# Patient Record
Sex: Male | Born: 1955
Health system: Southern US, Community
[De-identification: ages and names within clinical notes are randomized; demographics above are authoritative.]

## PROBLEM LIST (undated history)

## (undated) DIAGNOSIS — M1990 Unspecified osteoarthritis, unspecified site: Secondary | ICD-10-CM

## (undated) DIAGNOSIS — I10 Essential (primary) hypertension: Secondary | ICD-10-CM

## (undated) HISTORY — DX: Essential (primary) hypertension: I10

## (undated) HISTORY — DX: Unspecified osteoarthritis, unspecified site: M19.90

## (undated) HISTORY — PX: INGUINAL HERNIA REPAIR: SUR1180

---

## 2001-11-30 ENCOUNTER — Encounter: Admission: RE | Admit: 2001-11-30 | Discharge: 2002-01-31 | Payer: Self-pay

## 2001-12-28 ENCOUNTER — Ambulatory Visit (HOSPITAL_COMMUNITY): Admission: RE | Admit: 2001-12-28 | Discharge: 2001-12-28 | Payer: Self-pay

## 2002-07-03 ENCOUNTER — Encounter: Payer: Self-pay | Admitting: Family Medicine

## 2002-07-03 ENCOUNTER — Encounter: Admission: RE | Admit: 2002-07-03 | Discharge: 2002-07-03 | Payer: Self-pay | Admitting: Family Medicine

## 2003-11-27 ENCOUNTER — Encounter: Admission: RE | Admit: 2003-11-27 | Discharge: 2003-11-27 | Payer: Self-pay | Admitting: Family Medicine

## 2004-06-09 ENCOUNTER — Ambulatory Visit: Payer: Self-pay | Admitting: Family Medicine

## 2004-06-23 ENCOUNTER — Ambulatory Visit: Payer: Self-pay | Admitting: Family Medicine

## 2004-08-27 ENCOUNTER — Ambulatory Visit: Payer: Self-pay | Admitting: Family Medicine

## 2005-06-16 ENCOUNTER — Ambulatory Visit: Payer: Self-pay | Admitting: Family Medicine

## 2006-01-05 ENCOUNTER — Ambulatory Visit: Payer: Self-pay | Admitting: Internal Medicine

## 2006-01-19 ENCOUNTER — Ambulatory Visit: Payer: Self-pay | Admitting: Family Medicine

## 2006-04-12 ENCOUNTER — Ambulatory Visit: Payer: Self-pay | Admitting: Family Medicine

## 2006-05-04 HISTORY — PX: COLONOSCOPY: SHX174

## 2006-06-22 ENCOUNTER — Ambulatory Visit: Payer: Self-pay | Admitting: Family Medicine

## 2006-06-23 LAB — CONVERTED CEMR LAB
ALT: 23 units/L (ref 0–40)
AST: 25 units/L (ref 0–37)
Albumin: 3.7 g/dL (ref 3.5–5.2)
Alkaline Phosphatase: 53 units/L (ref 39–117)
BUN: 6 mg/dL (ref 6–23)
Basophils Absolute: 0 10*3/uL (ref 0.0–0.1)
Basophils Relative: 0.5 % (ref 0.0–1.0)
Bilirubin, Direct: 0.1 mg/dL (ref 0.0–0.3)
CO2: 30 meq/L (ref 19–32)
Calcium: 8.8 mg/dL (ref 8.4–10.5)
Chloride: 107 meq/L (ref 96–112)
Cholesterol: 213 mg/dL (ref 0–200)
Creatinine, Ser: 0.6 mg/dL (ref 0.4–1.5)
Direct LDL: 151.8 mg/dL
Eosinophils Absolute: 0.1 10*3/uL (ref 0.0–0.6)
Eosinophils Relative: 2.6 % (ref 0.0–5.0)
GFR calc Af Amer: 185 mL/min
GFR calc non Af Amer: 153 mL/min
Glucose, Bld: 84 mg/dL (ref 70–99)
HCT: 34.9 % — ABNORMAL LOW (ref 39.0–52.0)
HDL: 39.8 mg/dL (ref >39.0)
Hemoglobin: 11.8 g/dL — ABNORMAL LOW (ref 13.0–17.0)
Lymphocytes Relative: 47.4 % — ABNORMAL HIGH (ref 12.0–46.0)
MCHC: 33.7 g/dL (ref 30.0–36.0)
MCV: 100.6 fL — ABNORMAL HIGH (ref 78.0–100.0)
Monocytes Absolute: 0.5 10*3/uL (ref 0.2–0.7)
Monocytes Relative: 8.3 % (ref 3.0–11.0)
Neutro Abs: 2.3 10*3/uL (ref 1.4–7.7)
Neutrophils Relative %: 41.2 % — ABNORMAL LOW (ref 43.0–77.0)
PSA: 0.57 ng/mL (ref 0.10–4.00)
Platelets: 250 10*3/uL (ref 150–400)
Potassium: 3.8 meq/L (ref 3.5–5.1)
RBC: 3.47 M/uL — ABNORMAL LOW (ref 4.22–5.81)
RDW: 11.3 % — ABNORMAL LOW (ref 11.5–14.6)
Sodium: 142 meq/L (ref 135–145)
TSH: 0.7 microintl units/mL (ref 0.35–5.50)
Total Bilirubin: 1.2 mg/dL (ref 0.3–1.2)
Total CHOL/HDL Ratio: 5.4
Total Protein: 6.9 g/dL (ref 6.0–8.3)
Triglycerides: 110 mg/dL (ref 0–149)
VLDL: 22 mg/dL (ref 0–40)
WBC: 5.6 10*3/uL (ref 4.5–10.5)

## 2006-06-28 ENCOUNTER — Ambulatory Visit: Payer: Self-pay | Admitting: Family Medicine

## 2006-06-28 LAB — CONVERTED CEMR LAB
Retic Count, Absolute: 22 (ref 19.0–186.0)
Retic Ct Pct: 0.6 % (ref 0.4–3.1)

## 2006-06-29 LAB — CONVERTED CEMR LAB
Basophils Absolute: 0.1 10*3/uL (ref 0.0–0.1)
Basophils Relative: 1.7 % — ABNORMAL HIGH (ref 0.0–1.0)
Eosinophils Absolute: 0.1 10*3/uL (ref 0.0–0.6)
Eosinophils Relative: 3.5 % (ref 0.0–5.0)
Ferritin: 193 ng/mL (ref 22.0–322.0)
Folate: 6.1 ng/mL
HCT: 35.3 % — ABNORMAL LOW (ref 39.0–52.0)
Hemoglobin: 11.8 g/dL — ABNORMAL LOW (ref 13.0–17.0)
Iron: 52 ug/dL (ref 42–165)
Lymphocytes Relative: 32.5 % (ref 12.0–46.0)
MCHC: 33.3 g/dL (ref 30.0–36.0)
MCV: 100.7 fL — ABNORMAL HIGH (ref 78.0–100.0)
Monocytes Absolute: 0.3 10*3/uL (ref 0.2–0.7)
Monocytes Relative: 7.4 % (ref 3.0–11.0)
Neutro Abs: 2.2 10*3/uL (ref 1.4–7.7)
Neutrophils Relative %: 54.9 % (ref 43.0–77.0)
Platelets: 202 10*3/uL (ref 150–400)
RBC: 3.51 M/uL — ABNORMAL LOW (ref 4.22–5.81)
RDW: 11.4 % — ABNORMAL LOW (ref 11.5–14.6)
Saturation Ratios: 17.3 % — ABNORMAL LOW (ref 20.0–50.0)
Transferrin: 214.4 mg/dL (ref >=212.0)
Vitamin B-12: 1471 pg/mL — ABNORMAL HIGH (ref 211–911)
WBC: 4 10*3/uL — ABNORMAL LOW (ref 4.5–10.5)

## 2006-07-15 ENCOUNTER — Ambulatory Visit: Payer: Self-pay | Admitting: Internal Medicine

## 2006-08-12 ENCOUNTER — Encounter: Payer: Self-pay | Admitting: Family Medicine

## 2006-08-12 ENCOUNTER — Ambulatory Visit: Payer: Self-pay | Admitting: Internal Medicine

## 2006-12-27 DIAGNOSIS — M545 Low back pain: Secondary | ICD-10-CM

## 2006-12-27 DIAGNOSIS — I10 Essential (primary) hypertension: Secondary | ICD-10-CM | POA: Insufficient documentation

## 2006-12-27 DIAGNOSIS — M1990 Unspecified osteoarthritis, unspecified site: Secondary | ICD-10-CM | POA: Insufficient documentation

## 2007-02-11 DIAGNOSIS — J069 Acute upper respiratory infection, unspecified: Secondary | ICD-10-CM | POA: Insufficient documentation

## 2007-02-15 ENCOUNTER — Ambulatory Visit: Payer: Self-pay | Admitting: Family Medicine

## 2007-02-15 DIAGNOSIS — F17200 Nicotine dependence, unspecified, uncomplicated: Secondary | ICD-10-CM

## 2007-06-16 ENCOUNTER — Ambulatory Visit: Payer: Self-pay | Admitting: Family Medicine

## 2007-06-16 LAB — CONVERTED CEMR LAB
Albumin: 3.6 g/dL (ref 3.5–5.2)
Basophils Absolute: 0 10*3/uL (ref 0.0–0.1)
Bilirubin Urine: NEGATIVE
Blood in Urine, dipstick: NEGATIVE
Cholesterol: 222 mg/dL (ref 0–200)
Direct LDL: 151.1 mg/dL
Eosinophils Absolute: 0.1 10*3/uL (ref 0.0–0.6)
GFR calc Af Amer: 132 mL/min
GFR calc non Af Amer: 109 mL/min
Glucose, Urine, Semiquant: NEGATIVE
HCT: 35.2 % — ABNORMAL LOW (ref 39.0–52.0)
Hemoglobin: 12 g/dL — ABNORMAL LOW (ref 13.0–17.0)
Ketones, urine, test strip: NEGATIVE
MCHC: 34.2 g/dL (ref 30.0–36.0)
MCV: 99.1 fL (ref 78.0–100.0)
Monocytes Absolute: 0.3 10*3/uL (ref 0.2–0.7)
Neutrophils Relative %: 50.9 % (ref 43.0–77.0)
Nitrite: NEGATIVE
PSA: 0.62 ng/mL (ref 0.10–4.00)
Potassium: 4.5 meq/L (ref 3.5–5.1)
Protein, U semiquant: NEGATIVE
Sodium: 141 meq/L (ref 135–145)
Specific Gravity, Urine: >=1.03
Triglycerides: 72 mg/dL (ref 0–149)
Urobilinogen, UA: 2
WBC Urine, dipstick: NEGATIVE
pH: 6

## 2007-06-23 ENCOUNTER — Ambulatory Visit: Payer: Self-pay | Admitting: Family Medicine

## 2007-06-23 DIAGNOSIS — F528 Other sexual dysfunction not due to a substance or known physiological condition: Secondary | ICD-10-CM

## 2007-06-23 DIAGNOSIS — N529 Male erectile dysfunction, unspecified: Secondary | ICD-10-CM | POA: Insufficient documentation

## 2007-08-17 ENCOUNTER — Ambulatory Visit: Payer: Self-pay | Admitting: Family Medicine

## 2007-08-18 ENCOUNTER — Encounter (INDEPENDENT_AMBULATORY_CARE_PROVIDER_SITE_OTHER): Payer: Self-pay | Admitting: *Deleted

## 2007-10-07 ENCOUNTER — Ambulatory Visit: Payer: Self-pay | Admitting: Family Medicine

## 2007-10-07 DIAGNOSIS — IMO0001 Reserved for inherently not codable concepts without codable children: Secondary | ICD-10-CM

## 2007-11-14 ENCOUNTER — Ambulatory Visit: Payer: Self-pay | Admitting: Family Medicine

## 2008-09-18 ENCOUNTER — Ambulatory Visit: Payer: Self-pay | Admitting: Family Medicine

## 2008-09-18 DIAGNOSIS — M7650 Patellar tendinitis, unspecified knee: Secondary | ICD-10-CM

## 2008-09-18 LAB — CONVERTED CEMR LAB
Bilirubin Urine: NEGATIVE
Glucose, Urine, Semiquant: NEGATIVE
Urobilinogen, UA: 2

## 2008-09-27 LAB — CONVERTED CEMR LAB
Albumin: 4 g/dL (ref 3.5–5.2)
Basophils Relative: 0.3 % (ref 0.0–3.0)
CO2: 32 meq/L (ref 19–32)
Chloride: 104 meq/L (ref 96–112)
Cholesterol: 219 mg/dL — ABNORMAL HIGH (ref 0–200)
Creatinine, Ser: 0.9 mg/dL (ref 0.4–1.5)
Direct LDL: 154.4 mg/dL
Eosinophils Absolute: 0.1 10*3/uL (ref 0.0–0.7)
HCT: 35.1 % — ABNORMAL LOW (ref 39.0–52.0)
Hemoglobin: 12 g/dL — ABNORMAL LOW (ref 13.0–17.0)
Lymphs Abs: 2.4 10*3/uL (ref 0.7–4.0)
MCHC: 34.1 g/dL (ref 30.0–36.0)
MCV: 101.1 fL — ABNORMAL HIGH (ref 78.0–100.0)
Monocytes Absolute: 0.2 10*3/uL (ref 0.1–1.0)
Neutro Abs: 2.4 10*3/uL (ref 1.4–7.7)
Neutrophils Relative %: 47.6 % (ref 43.0–77.0)
PSA: 0.54 ng/mL (ref 0.10–4.00)
RBC: 3.47 M/uL — ABNORMAL LOW (ref 4.22–5.81)
TSH: 1.07 microintl units/mL (ref 0.35–5.50)
Total CHOL/HDL Ratio: 5
Total Protein: 7.6 g/dL (ref 6.0–8.3)

## 2008-11-02 ENCOUNTER — Ambulatory Visit: Payer: Self-pay | Admitting: Family Medicine

## 2008-11-02 DIAGNOSIS — D649 Anemia, unspecified: Secondary | ICD-10-CM | POA: Insufficient documentation

## 2008-11-13 ENCOUNTER — Ambulatory Visit: Payer: Self-pay | Admitting: Family Medicine

## 2008-11-14 DIAGNOSIS — H8110 Benign paroxysmal vertigo, unspecified ear: Secondary | ICD-10-CM | POA: Insufficient documentation

## 2008-11-15 ENCOUNTER — Ambulatory Visit: Payer: Self-pay | Admitting: Internal Medicine

## 2008-11-26 LAB — CBC & DIFF AND RETIC
Basophils Absolute: 0 10*3/uL (ref 0.0–0.1)
Eosinophils Absolute: 0.1 10*3/uL (ref 0.0–0.5)
HGB: 11.7 g/dL — ABNORMAL LOW (ref 13.0–17.1)
Immature Retic Fract: 4 % (ref 0.00–13.40)
NEUT#: 2 10*3/uL (ref 1.5–6.5)
RDW: 11.2 % (ref 11.0–14.6)
Retic Ct Abs: 59.07 10*3/uL (ref 24.10–77.50)
lymph#: 2.4 10*3/uL (ref 0.9–3.3)

## 2008-11-28 LAB — PROTEIN ELECTROPHORESIS, SERUM
Albumin ELP: 57.9 % (ref 55.8–66.1)
Beta Globulin: 6.8 % (ref 4.7–7.2)
Total Protein, Serum Electrophoresis: 7.2 g/dL (ref 6.0–8.3)

## 2008-11-28 LAB — COMPREHENSIVE METABOLIC PANEL
AST: 15 U/L (ref 0–37)
Albumin: 4.2 g/dL (ref 3.5–5.2)
BUN: 13 mg/dL (ref 6–23)
CO2: 26 mEq/L (ref 19–32)
Calcium: 9.1 mg/dL (ref 8.4–10.5)
Chloride: 102 mEq/L (ref 96–112)
Glucose, Bld: 157 mg/dL — ABNORMAL HIGH (ref 70–99)
Potassium: 3.6 mEq/L (ref 3.5–5.3)

## 2008-11-28 LAB — LACTATE DEHYDROGENASE: LDH: 138 U/L (ref 94–250)

## 2008-11-28 LAB — FOLATE: Folate: 12.3 ng/mL

## 2008-11-28 LAB — IRON AND TIBC: UIBC: 206 ug/dL

## 2008-12-05 LAB — CBC WITH DIFFERENTIAL/PLATELET
BASO%: 0.6 % (ref 0.0–2.0)
HCT: 33.6 % — ABNORMAL LOW (ref 38.4–49.9)
MCHC: 35.1 g/dL (ref 32.0–36.0)
MONO#: 0.4 10*3/uL (ref 0.1–0.9)
NEUT#: 1.7 10*3/uL (ref 1.5–6.5)
NEUT%: 35.3 % — ABNORMAL LOW (ref 39.0–75.0)
RBC: 3.57 10*6/uL — ABNORMAL LOW (ref 4.20–5.82)
WBC: 4.8 10*3/uL (ref 4.0–10.3)
lymph#: 2.6 10*3/uL (ref 0.9–3.3)
nRBC: 0 % (ref 0–0)

## 2009-02-01 ENCOUNTER — Ambulatory Visit: Payer: Self-pay | Admitting: Internal Medicine

## 2009-02-05 LAB — CBC WITH DIFFERENTIAL/PLATELET
BASO%: 0.7 % (ref 0.0–2.0)
Eosinophils Absolute: 0.1 10*3/uL (ref 0.0–0.5)
LYMPH%: 46.6 % (ref 14.0–49.0)
MONO#: 0.3 10*3/uL (ref 0.1–0.9)
NEUT#: 1.9 10*3/uL (ref 1.5–6.5)
Platelets: 223 10*3/uL (ref 140–400)
RBC: 3.5 10*6/uL — ABNORMAL LOW (ref 4.20–5.82)
RDW: 12 % (ref 11.0–14.6)
WBC: 4.5 10*3/uL (ref 4.0–10.3)
lymph#: 2.1 10*3/uL (ref 0.9–3.3)

## 2009-02-05 LAB — COMPREHENSIVE METABOLIC PANEL
BUN: 10 mg/dL (ref 6–23)
CO2: 27 mEq/L (ref 19–32)
Glucose, Bld: 153 mg/dL — ABNORMAL HIGH (ref 70–99)
Sodium: 133 mEq/L — ABNORMAL LOW (ref 135–145)
Total Bilirubin: 1.2 mg/dL (ref 0.3–1.2)
Total Protein: 7.5 g/dL (ref 6.0–8.3)

## 2009-02-05 LAB — TSH: TSH: 1.105 u[IU]/mL (ref 0.350–4.500)

## 2009-05-02 ENCOUNTER — Ambulatory Visit: Payer: Self-pay | Admitting: Internal Medicine

## 2009-06-04 ENCOUNTER — Ambulatory Visit: Payer: Self-pay | Admitting: Internal Medicine

## 2009-06-04 LAB — CBC WITH DIFFERENTIAL/PLATELET
Eosinophils Absolute: 0.1 10*3/uL (ref 0.0–0.5)
LYMPH%: 32.1 % (ref 14.0–49.0)
MONO#: 0.6 10*3/uL (ref 0.1–0.9)
NEUT#: 4 10*3/uL (ref 1.5–6.5)
Platelets: 226 10*3/uL (ref 140–400)
RBC: 3.71 10*6/uL — ABNORMAL LOW (ref 4.20–5.82)
RDW: 11.5 % (ref 11.0–14.6)
WBC: 6.8 10*3/uL (ref 4.0–10.3)
nRBC: 0 % (ref 0–0)

## 2009-07-25 ENCOUNTER — Ambulatory Visit: Payer: Self-pay | Admitting: Internal Medicine

## 2009-07-29 LAB — CBC WITH DIFFERENTIAL/PLATELET
Eosinophils Absolute: 0.1 10*3/uL (ref 0.0–0.5)
HCT: 36.3 % — ABNORMAL LOW (ref 38.4–49.9)
LYMPH%: 42.3 % (ref 14.0–49.0)
MONO#: 0.3 10*3/uL (ref 0.1–0.9)
NEUT#: 2.4 10*3/uL (ref 1.5–6.5)
NEUT%: 48.5 % (ref 39.0–75.0)
Platelets: 236 10*3/uL (ref 140–400)
WBC: 4.9 10*3/uL (ref 4.0–10.3)
lymph#: 2.1 10*3/uL (ref 0.9–3.3)

## 2009-07-29 LAB — FERRITIN: Ferritin: 142 ng/mL (ref 22–322)

## 2009-07-29 LAB — COMPREHENSIVE METABOLIC PANEL
ALT: 26 U/L (ref 0–53)
CO2: 29 mEq/L (ref 19–32)
Calcium: 9.3 mg/dL (ref 8.4–10.5)
Chloride: 106 mEq/L (ref 96–112)
Glucose, Bld: 102 mg/dL — ABNORMAL HIGH (ref 70–99)
Sodium: 142 mEq/L (ref 135–145)
Total Bilirubin: 0.8 mg/dL (ref 0.3–1.2)
Total Protein: 7.8 g/dL (ref 6.0–8.3)

## 2009-07-29 LAB — IRON AND TIBC
Iron: 141 ug/dL (ref 42–165)
UIBC: 207 ug/dL

## 2009-11-05 ENCOUNTER — Ambulatory Visit: Payer: Self-pay | Admitting: Family Medicine

## 2009-11-05 DIAGNOSIS — L253 Unspecified contact dermatitis due to other chemical products: Secondary | ICD-10-CM | POA: Insufficient documentation

## 2010-01-24 ENCOUNTER — Ambulatory Visit: Payer: Self-pay | Admitting: Internal Medicine

## 2010-06-01 LAB — CONVERTED CEMR LAB: Folate: 7 ng/mL

## 2010-06-05 NOTE — Assessment & Plan Note (Signed)
Summary: ?poison oak/njr   Vital Signs:  Patient profile:   55 year old male Height:      69 inches Weight:      187 pounds BMI:     27.71 Temp:     98.1 degrees F oral BP sitting:   110 / 80  (left arm) Cuff size:   regular  Vitals Entered By: Kern Reap CMA Duncan Dull) (November 05, 2009 4:28 PM) CC: poison ivy   CC:  poison ivy.  History of Present Illness: Jeffrey Wagner is a 55 year old male, who comes in today for evaluation of a contact dermatitis x 5 days.  The rash involves his arms and his legs.  Allergies: No Known Drug Allergies  Past History:  Past medical, surgical, family and social histories (including risk factors) reviewed for relevance to current acute and chronic problems.  Past Medical History: Reviewed history from 12/27/2006 and no changes required. Hypertension Low back pain Osteoarthritis  Past Surgical History: Reviewed history from 12/27/2006 and no changes required. Colonoscopy Double Hernia  Family History: Reviewed history from 12/27/2006 and no changes required. Family History Hypertension Family History Lung cancer  Social History: Reviewed history from 12/27/2006 and no changes required. Occupation: Married Current Smoker Alcohol use-yes Drug use-no  Review of Systems      See HPI  Physical Exam  General:  Well-developed,well-nourished,in no acute distress; alert,appropriate and cooperative throughout examination Skin:  rash consistent with a contact dermatitis involving the arms and legs   Problems:  Medical Problems Added: 1)  Dx of Cntc Dermatitis&oth Eczema Due Oth Chem Products  (ICD-692.4)  Impression & Recommendations:  Problem # 1:  CNTC DERMATITIS&OTH ECZEMA DUE OTH CHEM PRODUCTS (ICD-692.4) Assessment New  The following medications were removed from the medication list:    Prednisone 20 Mg Tabs (Prednisone) ..... Uad His updated medication list for this problem includes:    Prednisone 20 Mg Tabs (Prednisone) .....  Uad  Orders: Prescription Created Electronically [phone])  Complete Medication List: 1)  Atenolol-chlorthalidone 50-25 Mg Tabs (Atenolol-chlorthalidone) .... Take 1 tablet by mouth once a day 2)  Viagra 50 Mg Tabs (Sildenafil citrate) .... Uad 3)  Adult Aspirin Ec Low Strength 81 Mg Tbec (Aspirin) .... Once daily 4)  Chantix Starting Month Pak 0.5 Mg X 11 & 1 Mg X 42 Tabs (Varenicline tartrate) .... Uad 5)  Chantix Continuing Month Pak 1 Mg Tabs (Varenicline tartrate) .... Uad 6)  Prednisone 20 Mg Tabs (Prednisone) .... Uad  Patient Instructions: 1)  begin prednisone two tabs x 3 days, one x 3 days, a half x 3 days, then half a tablet Monday, Wednesday, Friday, for a two week taper Prescriptions: PREDNISONE 20 MG TABS (PREDNISONE) UAD  #30 x 1   Entered and Authorized by:   Roderick Pee MD   Signed by:   Roderick Pee MD on 11/05/2009   Method used:   Electronically to        [facility]  [address] [phone]* (retail)       [address]       [place], [place]  [id number]       Ph: [phone] or [phone]       Fax: [phone]   RxID:   [id number]

## 2010-09-19 NOTE — Letter (Signed)
July 15, 2006    Tinnie Gens A. Tawanna Cooler, MD  [address] [place], [place] [address]   RE:  Jeffrey Wagner, Jeffrey Wagner  MRN:  [id number]  /  DOB:  1955-07-04   Dear Trey Paula:   I saw [name] in the office today.  He has an anemia with an  elevated MCV and a low iron saturation, and a normal ferritin.  I am  going to go ahead with a colonoscopy because he certainly could have a  mixed picture with some iron deficiency.  If that is unrevealing, I  think a hematology evaluation may make sense.  I will let you know the  results of the colonoscopy.   As always, I appreciate the opportunity to care for your patients.  A  full consultation note was dictated as well.    Sincerely,      Iva Boop, MD,FACG  Electronically Signed    CEG/MedQ  DD: 07/15/2006  DT: 07/16/2006  Job #: [id number]

## 2010-09-19 NOTE — Assessment & Plan Note (Signed)
[facility]                         GASTROENTEROLOGY OFFICE NOTE   NAME:Wagner, Jeffrey                          MRN:          [id number]  DATE:07/15/2006                            DOB:          [birth year]    REASON FOR CONSULTATION:  Anemia.   ASSESSMENT:  Anemia.  Picture is somewhat confusing.  He has a low iron  saturation, and a normal ferritin.  His MCV is 100.7.  His B12 and  folate are either normal or high.  I suppose the mixed picture with some  iron deficiency anemia is possible.   PLAN:  1. Go ahead with a colonoscopy given his anemia and his age.  He is      close enough to 52, and some people have suggested that screening      begin at age 55 in African Americans.  Risks, benefits, and      indications of colonoscopy are explained, and he understands and      agrees to proceed.  2. Note, he was Hemoccult negative, and he has no signs or symptoms of      bleeding.  3. If the colonoscopy is negative, I think a hematology evaluation      makes the most sense after that.  He says he has never been anemic      in the past.  A bone marrow disorder is certainly possible.   HISTORY:  [name] who has some gas  symptoms, but otherwise a totally negative GI review of systems.  He  went to see Dr. Tawanna Wagner on February 20th for his physical. At that time, he  had an anemia with a hemoglobin of 11.8, a MCV of 100.7.  His white  blood cell count was normal.  Because of this, Dr. Tawanna Wagner ordered anemia  workup, repeated the CBC with identical hemoglobin, but his white count  was 4.  The differential was basically normal.  Platelets are normal.  His iron saturation was 17% with a iron of 52, and a transferrin of 214.  His ferritin is 193, and his folate is 6.1 with a B12 1471.  His  creatinine is normal.  His RDW was slightly low at 11.  He has a normal  TSH.  His other labs, including liver function tests, were normal  except  for a total cholesterol of 213, and a PSA was normal.  He has no family  history of anemia issues that he has noted.  There is no family history  of colono cancer.   PAST MEDICAL HISTORY:  1. Hypertension.  2. Osteoarthritis.   MEDICATIONS:  1. Atenolol daily.  2. Ibuprofen once or twice a day, 3 tablets at time.   NO DRUG ALLERGIES.   FAMILY HISTORY:  Our review is otherwise negative.   SOCIAL HISTORY:  The patient is negative.  Two sons and 1 daughter.  He  has some college education.  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  He smokes 3 cigarettes a day.  No other tobacco or alcohol at all.  No  drug use.  He has some joint and back pain, and some insomnia issues,  otherwise negative.   PHYSICAL EXAMINATION:  Reveals a well-developed, well-nourished black  man.  Height 5 feet 9.  Weight 197 pounds.  Blood pressure 120/64.  Pulse 72.  The eyes are anicteric.  ENT:  He has multiple fillings, and is missing  some of his molars.  Oral mucosa, tongue, and posterior pharynx look  normal otherwise.  NECK:  Supple without thyromegaly or mass.  LUNGS:  Clear.  HEART:  S1 and S2.  I hear no rubs, murmurs, or gallops.  ABDOMEN:  Soft and non-tender without any hepatosplenomegaly or mass.  Perhaps it is mildly distended.  RECTAL:  Exam is deferred.  Dr. Nelida Wagner rectal exam revealed no  abnormality, and he was Hemoccult negative.  LYMPHATIC:  No neck, supraclavicular, inguinal, or axillary adenopathy  palpated.  LOWER EXTREMITIES:  Free of edema.  SKIN:  Shows some thickening and hypopigmentation of the pretibial areas  bilaterally.  PSYCHIATRIC:  He appears alert and oriented x3.   I appreciate the opportunity to care for this patient.  I have reviewed  the office notes and laboratory sent by Dr. Tawanna Wagner.     Jeffrey Boop, MD,FACG  Electronically Signed    CEG/MedQ  DD: 07/15/2006  DT: 07/16/2006  Job #: [id number]

## 2010-09-19 NOTE — Consult Note (Signed)
NAME:  Jeffrey Wagner, Jeffrey Wagner NO.:  [id number]   MEDICAL RECORD NO.:  [id number]                    PATIENT TYPE:   LOCATION:                                       FACILITY:   PHYSICIAN:  Sondra Come, D.O.                 DATE OF BIRTH:   DATE OF CONSULTATION:  01/06/2002  DATE OF DISCHARGE:                                   CONSULTATION   HISTORY OF PRESENT ILLNESS:  The patient returns to clinic today as  scheduled for reevaluation.  He was last seen on December 22, 2001.  In the  interim, he has had an MRI of his lumbar spine.  The MRI revealed mild facet  arthropathic changes at L3-4, L4-5, and L5-S1 bilaterally. In addition, the  patient has been continuing with physical therapy and states that he is  significantly better overall in terms of his low back pain and right lower  extremity radicular symptoms.  In fact, he states that his low back pain and  radicular symptoms have essentially resolved.  He is not currently taking  medications which previously included Celebrex and Ultracet.  He rates his  pain as mild, if any.  His function and quality of life indices have  improved.  He has been able to lift fairly heavy objects at work without any  problem.  I reviewed the health and history form and 14-point review of  systems.  I reviewed the patient's home exercise program with him and this  is appropriate. This includes lumbar stabilization exercises as well as  stretching.   PHYSICAL EXAMINATION:  GENERAL APPEARANCE:  A healthy male in no acute  distress.  VITAL SIGNS:  BACK:  Level pelvis without scoliosis.  There is no tenderness to palpation  bilateral lumbar paraspinal muscles at this time.  Full range of motion  without discomfort.  NEUROLOGIC:  Manual muscle testing is 5/5 bilateral lower extremities.  Sensory examination is intact to light touch bilateral lower extremities.  Muscle stretch reflexes are 2+/4 bilateral patellar and medial  hamstrings  and Achilles.  Straight leg raising is negative bilaterally at this time.  The patient does have mildly tight hamstrings.  Hip flexors are tight as  well.  Pearlean Brownie test is negative bilaterally.   IMPRESSION:  Low back pain with right lower extremity radicular symptoms in  an L5-S1 distribution, resolved.   PLAN:  1. Continue physical therapy and home exercise program.  2. Continue Celebrex and Ultracet just as needed.  3. The patient to return to clinic on an as needed basis.  If symptoms     worsen, or if symptoms return and persist, would consider lumbar facet     injections diagnostically and therapeutically.   The patient was educated on the above findings and recommendations and  understands.  There were no barriers to communication.  The patient is  to  follow up with his primary care physician.                                                Sondra Come, D.O.    JJW/MEDQ  D:  01/06/2002  T:  01/06/2002  Job:  [id number]   cc:   Tinnie Gens A. Tawanna Cooler, M.D. [facility]

## 2010-11-24 ENCOUNTER — Other Ambulatory Visit (INDEPENDENT_AMBULATORY_CARE_PROVIDER_SITE_OTHER): Payer: Self-pay

## 2010-11-24 DIAGNOSIS — Z0289 Encounter for other administrative examinations: Secondary | ICD-10-CM

## 2010-11-24 DIAGNOSIS — Z0000 Encounter for general adult medical examination without abnormal findings: Secondary | ICD-10-CM

## 2010-11-24 LAB — POCT URINALYSIS DIPSTICK
Bilirubin, UA: NEGATIVE
Ketones, UA: NEGATIVE
Spec Grav, UA: 1.025

## 2010-11-24 LAB — CBC WITH DIFFERENTIAL/PLATELET
Basophils Relative: 0.8 % (ref 0.0–3.0)
Eosinophils Absolute: 0.1 10*3/uL (ref 0.0–0.7)
HCT: 36.6 % — ABNORMAL LOW (ref 39.0–52.0)
Hemoglobin: 12.4 g/dL — ABNORMAL LOW (ref 13.0–17.0)
MCHC: 33.7 g/dL (ref 30.0–36.0)
MCV: 102.3 fl — ABNORMAL HIGH (ref 78.0–100.0)
Monocytes Absolute: 0.3 10*3/uL (ref 0.1–1.0)
Neutro Abs: 1.7 10*3/uL (ref 1.4–7.7)
RBC: 3.58 Mil/uL — ABNORMAL LOW (ref 4.22–5.81)

## 2010-11-24 LAB — BASIC METABOLIC PANEL
CO2: 30 mEq/L (ref 19–32)
Chloride: 100 mEq/L (ref 96–112)
Sodium: 137 mEq/L (ref 135–145)

## 2010-11-24 LAB — LDL CHOLESTEROL, DIRECT: Direct LDL: 143.3 mg/dL

## 2010-11-24 LAB — LIPID PANEL
HDL: 47.4 mg/dL (ref >39.00)
Triglycerides: 89 mg/dL (ref 0.0–149.0)

## 2010-11-24 LAB — HEPATIC FUNCTION PANEL
Albumin: 4.3 g/dL (ref 3.5–5.2)
Total Protein: 7.7 g/dL (ref 6.0–8.3)

## 2010-11-24 LAB — PSA: PSA: 0.62 ng/mL (ref 0.10–4.00)

## 2011-01-13 ENCOUNTER — Encounter: Payer: Self-pay | Admitting: Family Medicine

## 2011-01-13 ENCOUNTER — Ambulatory Visit (INDEPENDENT_AMBULATORY_CARE_PROVIDER_SITE_OTHER): Payer: Self-pay | Admitting: Family Medicine

## 2011-01-13 DIAGNOSIS — D649 Anemia, unspecified: Secondary | ICD-10-CM

## 2011-01-13 DIAGNOSIS — F17200 Nicotine dependence, unspecified, uncomplicated: Secondary | ICD-10-CM

## 2011-01-13 DIAGNOSIS — I10 Essential (primary) hypertension: Secondary | ICD-10-CM

## 2011-01-13 DIAGNOSIS — Z23 Encounter for immunization: Secondary | ICD-10-CM

## 2011-01-13 DIAGNOSIS — F528 Other sexual dysfunction not due to a substance or known physiological condition: Secondary | ICD-10-CM

## 2011-01-13 DIAGNOSIS — Z0000 Encounter for general adult medical examination without abnormal findings: Secondary | ICD-10-CM

## 2011-01-13 MED ORDER — ATENOLOL-CHLORTHALIDONE 50-25 MG PO TABS: 1.0000 | ORAL_TABLET | Freq: Every day | ORAL | Status: DC

## 2011-01-13 MED ORDER — VARENICLINE TARTRATE 1 MG PO TABS: 1.0000 mg | ORAL_TABLET | Freq: Two times a day (BID) | ORAL | Status: AC

## 2011-01-13 MED ORDER — SILDENAFIL CITRATE 50 MG PO TABS: 50.0000 mg | ORAL_TABLET | Freq: Every day | ORAL | Status: DC | PRN

## 2011-01-13 NOTE — Patient Instructions (Signed)
Continue your current medications.  Avoid sugar.  Begin the chantix program by taking half a tablet a day.  Return in 4 weeks for follow-up.  One q. Start the chantix, then begin tapering your cigarettes         8.......6.........4....2  ...........1........0

## 2011-01-13 NOTE — Progress Notes (Signed)
  Subjective:    Patient ID: Jeffrey Wagner, male    DOB: 20-Dec-1955, 55 y.o.   MRN: [id number]  HPI Jeffrey Wagner is a 55 year old Married male, smoker, who comes in today for general physical examination  He is a history of underlying hypertension, for which he takes Tenoretic 50 days, 25 daily, BP 124/86.  He uses Viagra p.r.n. For ED.  He smokes 10 cigarettes a day, and we did start him on the chantix program last year, but he never got the medicine filled.  He gets routine eye care.  Every 4 years recommend yearly, regular dental care, colonoscopy, normal, tetanus, 2009, seasonal flu shot today.   Review of Systems  Constitutional: Negative.   HENT: Negative.   Eyes: Negative.   Respiratory: Negative.   Cardiovascular: Negative.   Gastrointestinal: Negative.   Genitourinary: Negative.   Musculoskeletal: Negative.   Skin: Negative.   Neurological: Negative.   Hematological: Negative.   Psychiatric/Behavioral: Negative.        Objective:   Physical Exam  Constitutional: He is oriented to person, place, and time. He appears well-developed and well-nourished.  HENT:  Head: Normocephalic and atraumatic.  Right Ear: External ear normal.  Left Ear: External ear normal.  Nose: Nose normal.  Mouth/Throat: Oropharynx is clear and moist.  Eyes: Conjunctivae and EOM are normal. Pupils are equal, round, and reactive to light.  Neck: Normal range of motion. Neck supple. No JVD present. No tracheal deviation present. No thyromegaly present.  Cardiovascular: Normal rate, regular rhythm, normal heart sounds and intact distal pulses.  Exam reveals no gallop and no friction rub.   No murmur heard. Pulmonary/Chest: Effort normal and breath sounds normal. No stridor. No respiratory distress. He has no wheezes. He has no rales. He exhibits no tenderness.  Abdominal: Soft. Bowel sounds are normal. He exhibits no distension and no mass. There is no tenderness. There is no rebound and no guarding.    Genitourinary: Rectum normal, prostate normal and penis normal. Guaiac negative stool. No penile tenderness.  Musculoskeletal: Normal range of motion. He exhibits no edema and no tenderness.  Lymphadenopathy:    He has no cervical adenopathy.  Neurological: He is alert and oriented to person, place, and time. He has normal reflexes. No cranial nerve deficit. He exhibits normal muscle tone.  Skin: Skin is warm and dry. No rash noted. No erythema. No pallor.  Psychiatric: He has a normal mood and affect. His behavior is normal. Judgment and thought content normal.          Assessment & Plan:  Females  Hypertension.  Continue current medication.  Tobacco abuse begin chantix follow-up in 4 weeks.  Glucose intolerance avoid sugar fasting sugar 131

## 2011-02-10 ENCOUNTER — Ambulatory Visit: Payer: Self-pay | Admitting: Family Medicine

## 2011-05-08 ENCOUNTER — Encounter: Payer: Self-pay | Admitting: Family

## 2011-05-08 ENCOUNTER — Ambulatory Visit (INDEPENDENT_AMBULATORY_CARE_PROVIDER_SITE_OTHER): Payer: Self-pay | Admitting: Family

## 2011-05-08 VITALS — BP 126/80 | Temp 98.6°F | Wt 198.0 lb

## 2011-05-08 DIAGNOSIS — I10 Essential (primary) hypertension: Secondary | ICD-10-CM

## 2011-05-08 MED ORDER — DOXYCYCLINE HYCLATE 100 MG PO TABS: 100.0000 mg | ORAL_TABLET | Freq: Two times a day (BID) | ORAL | Status: AC

## 2011-05-08 NOTE — Progress Notes (Signed)
  Subjective:    Patient ID: Jeffrey Wagner, male    DOB: 12-09-1955, 56 y.o.   MRN: [id number]  HPI 56 year old, black male, patient of Dr. Tawanna Cooler male with complaint of an abscess in his groin. It has been present  for about 2 weeks. The last couple days has been draining pus and blood. However is really no better.  He is anything on it. History of abscesses in the past.    Review of Systems  Constitutional: Negative.   Respiratory: Negative.   Cardiovascular: Negative.   Gastrointestinal: Negative.   Genitourinary: Negative.   Skin:       Abscess to the groin  Hematological: Positive for adenopathy.       Adenopathy in the left groin  Psychiatric/Behavioral: Negative.        Objective:   Physical Exam  Constitutional: He appears well-developed and well-nourished.  Cardiovascular: Normal rate and regular rhythm.   Pulmonary/Chest: Effort normal and breath sounds normal.  Skin: Skin is warm and dry.       Small abscess noted to the left groin. Minimal drainage. Moderately tender.  Psychiatric: He has a normal mood and affect.          Assessment & Plan:  Assessment: Abscess of the testicles   Plan: Doxycycline 100 mg twice a day x10 days. Ibuprofen for pain relief. Keep the area clean and dry. Call if symptoms worsen or persist. Recheck as scheduled and when necessary.

## 2011-05-08 NOTE — Patient Instructions (Signed)

## 2012-02-01 ENCOUNTER — Other Ambulatory Visit: Payer: Self-pay | Admitting: Family Medicine

## 2012-04-05 ENCOUNTER — Ambulatory Visit (INDEPENDENT_AMBULATORY_CARE_PROVIDER_SITE_OTHER): Payer: Self-pay | Admitting: Family Medicine

## 2012-04-05 ENCOUNTER — Encounter: Payer: Self-pay | Admitting: Family Medicine

## 2012-04-05 VITALS — BP 130/90 | Temp 98.2°F | Wt 188.0 lb

## 2012-04-05 DIAGNOSIS — Z23 Encounter for immunization: Secondary | ICD-10-CM

## 2012-04-05 DIAGNOSIS — H9311 Tinnitus, right ear: Secondary | ICD-10-CM | POA: Insufficient documentation

## 2012-04-05 DIAGNOSIS — H9319 Tinnitus, unspecified ear: Secondary | ICD-10-CM

## 2012-04-05 NOTE — Progress Notes (Signed)
  Subjective:    Patient ID: Jeffrey Wagner, male    DOB: [birth year], 56 y.o.   MRN: [id number]  HPI  Jaz is a 56 year old married male nonsmoker who comes in today for evaluation of ringing in his right ear  For the past month ago he's had a ringing sensation in his right ear comes and goes. No hearing loss.  Review of Systems General and ENT review of systems otherwise negative    Objective:   Physical Exam  Well-developed well-nourished male in no acute distress HEENT negative      Assessment & Plan:  Tinnitus right ear reassured return when necessary

## 2012-04-05 NOTE — Patient Instructions (Signed)
You have a condition called tinnitus that is benign and will come and go. If however you develop hearing loss it knees to be evaluated by an ear nose and throat specialist

## 2012-04-29 ENCOUNTER — Other Ambulatory Visit: Payer: Self-pay

## 2012-05-02 ENCOUNTER — Other Ambulatory Visit (INDEPENDENT_AMBULATORY_CARE_PROVIDER_SITE_OTHER): Payer: Self-pay

## 2012-05-02 DIAGNOSIS — Z0000 Encounter for general adult medical examination without abnormal findings: Secondary | ICD-10-CM

## 2012-05-02 LAB — BASIC METABOLIC PANEL
Chloride: 103 mEq/L (ref 96–112)
Creatinine, Ser: 0.8 mg/dL (ref 0.4–1.5)
GFR: 131.26 mL/min (ref >60.00)
Potassium: 4.1 mEq/L (ref 3.5–5.1)

## 2012-05-02 LAB — POCT URINALYSIS DIPSTICK
Bilirubin, UA: NEGATIVE
Ketones, UA: NEGATIVE
Leukocytes, UA: NEGATIVE
Protein, UA: NEGATIVE
Spec Grav, UA: 1.02

## 2012-05-02 LAB — HEPATIC FUNCTION PANEL
AST: 20 U/L (ref 0–37)
Albumin: 3.9 g/dL (ref 3.5–5.2)
Alkaline Phosphatase: 62 U/L (ref 39–117)
Bilirubin, Direct: 0.1 mg/dL (ref 0.0–0.3)

## 2012-05-02 LAB — LIPID PANEL
Cholesterol: 193 mg/dL (ref 0–200)
LDL Cholesterol: 133 mg/dL — ABNORMAL HIGH (ref 0–99)
Triglycerides: 62 mg/dL (ref 0.0–149.0)
VLDL: 12.4 mg/dL (ref 0.0–40.0)

## 2012-05-02 LAB — TSH: TSH: 0.74 u[IU]/mL (ref 0.35–5.50)

## 2012-05-03 LAB — CBC WITH DIFFERENTIAL/PLATELET
Basophils Absolute: 0 10*3/uL (ref 0.0–0.1)
Eosinophils Absolute: 0.1 10*3/uL (ref 0.0–0.7)
Lymphocytes Relative: 34.3 % (ref 12.0–46.0)
MCHC: 33.4 g/dL (ref 30.0–36.0)
Monocytes Absolute: 0.4 10*3/uL (ref 0.1–1.0)
Neutrophils Relative %: 54.3 % (ref 43.0–77.0)
RDW: 11.9 % (ref 11.5–14.6)

## 2012-05-06 ENCOUNTER — Ambulatory Visit (INDEPENDENT_AMBULATORY_CARE_PROVIDER_SITE_OTHER): Payer: Self-pay | Admitting: Family Medicine

## 2012-05-06 ENCOUNTER — Encounter: Payer: Self-pay | Admitting: Family Medicine

## 2012-05-06 VITALS — BP 160/94 | HR 104 | Temp 98.1°F | Ht 68.5 in | Wt 188.0 lb

## 2012-05-06 DIAGNOSIS — I10 Essential (primary) hypertension: Secondary | ICD-10-CM

## 2012-05-06 DIAGNOSIS — M545 Low back pain: Secondary | ICD-10-CM

## 2012-05-06 DIAGNOSIS — Z0000 Encounter for general adult medical examination without abnormal findings: Secondary | ICD-10-CM

## 2012-05-06 DIAGNOSIS — F17200 Nicotine dependence, unspecified, uncomplicated: Secondary | ICD-10-CM

## 2012-05-06 DIAGNOSIS — F528 Other sexual dysfunction not due to a substance or known physiological condition: Secondary | ICD-10-CM

## 2012-05-06 MED ORDER — VARENICLINE TARTRATE 1 MG PO TABS: ORAL_TABLET | ORAL | Status: DC

## 2012-05-06 MED ORDER — SILDENAFIL CITRATE 100 MG PO TABS: 50.0000 mg | ORAL_TABLET | Freq: Every day | ORAL | Status: DC | PRN

## 2012-05-06 MED ORDER — ATENOLOL-CHLORTHALIDONE 50-25 MG PO TABS: ORAL_TABLET | ORAL | Status: DC

## 2012-05-06 NOTE — Progress Notes (Signed)
  Subjective:    Patient ID: Jeffrey Wagner, male    DOB: 03/19/1956, 57 y.o.   MRN: [id number]  HPI Phelan is a 57 year old married male smoker one half pack of cigarettes a day for 35+ years who comes in today for general physical examination  He takes Tenoretic 1 daily for hypertension BP today 160/90 he forgot to take his medication today  He uses Viagra 50 mg but is not working. This is probably because the nicotine issue.  He now after many years is willing to try a smoking cessation program. He in his wife and both started an exercise program. She is a diabetic and has noticed a marked decrease in her blood sugar with daily exercise   Review of Systems  Constitutional: Negative.   HENT: Negative.   Eyes: Negative.   Respiratory: Negative.   Cardiovascular: Negative.   Gastrointestinal: Negative.   Genitourinary: Negative.   Musculoskeletal: Negative.   Skin: Negative.   Neurological: Negative.   Hematological: Negative.   Psychiatric/Behavioral: Negative.        Objective:   Physical Exam  Constitutional: He is oriented to person, place, and time. He appears well-developed and well-nourished.  HENT:  Head: Normocephalic and atraumatic.  Right Ear: External ear normal.  Left Ear: External ear normal.  Nose: Nose normal.  Mouth/Throat: Oropharynx is clear and moist.  Eyes: Conjunctivae normal and EOM are normal. Pupils are equal, round, and reactive to light.  Neck: Normal range of motion. Neck supple. No JVD present. No tracheal deviation present. No thyromegaly present.  Cardiovascular: Normal rate, regular rhythm, normal heart sounds and intact distal pulses.  Exam reveals no gallop and no friction rub.   No murmur heard. Pulmonary/Chest: Effort normal and breath sounds normal. No stridor. No respiratory distress. He has no wheezes. He has no rales. He exhibits no tenderness.  Abdominal: Soft. Bowel sounds are normal. He exhibits no distension and no mass. There is no  tenderness. There is no rebound and no guarding.  Genitourinary: Rectum normal, prostate normal and penis normal. Guaiac negative stool. No penile tenderness.  Musculoskeletal: Normal range of motion. He exhibits no edema and no tenderness.  Lymphadenopathy:    He has no cervical adenopathy.  Neurological: He is alert and oriented to person, place, and time. He has normal reflexes. No cranial nerve deficit. He exhibits normal muscle tone.  Skin: Skin is warm and dry. No rash noted. No erythema. No pallor.  Psychiatric: He has a normal mood and affect. His behavior is normal. Judgment and thought content normal.          Assessment & Plan:  Hypertension take Tenoretic daily  Erectile dysfunction increase Viagra to 100 mg when necessary  Abuse,,,,,,,,,,,,, begin the Chantix program return in one month for followup

## 2012-05-06 NOTE — Patient Instructions (Addendum)
Take the Tenoretic and aspirin one of each daily  Begin the Chantix 1 mg,,,,,,,,,,,, one half tab every morning  Begin tapering by 2 per week  Followup in 4 weeks

## 2012-06-06 ENCOUNTER — Ambulatory Visit (INDEPENDENT_AMBULATORY_CARE_PROVIDER_SITE_OTHER): Payer: Managed Care, Other (non HMO) | Admitting: Family Medicine

## 2012-06-06 ENCOUNTER — Encounter: Payer: Self-pay | Admitting: Family Medicine

## 2012-06-06 VITALS — BP 160/100 | Temp 98.8°F | Wt 192.0 lb

## 2012-06-06 DIAGNOSIS — I10 Essential (primary) hypertension: Secondary | ICD-10-CM

## 2012-06-06 DIAGNOSIS — F17200 Nicotine dependence, unspecified, uncomplicated: Secondary | ICD-10-CM

## 2012-06-06 MED ORDER — AMLODIPINE BESYLATE 5 MG PO TABS: 5.0000 mg | ORAL_TABLET | Freq: Every day | ORAL | Status: DC

## 2012-06-06 NOTE — Progress Notes (Signed)
  Subjective:    Patient ID: Quadre Bristol, male    DOB: 04-01-1956, 57 y.o.   MRN: [id number]  HPI  Chanoch is a 57 year old married male smoker who comes in today for followup of tobacco abuse and hypertension  He's on Tenoretic 50/25 daily for hypertension BP 160/100  A month ago we started him on the Chantix program with a 1 mg tablet dose one half tab every morning and he is decreased his cigarette consumption from 12-6 per day. He expresses a desire to quit completely  Review of Systems    review of systems otherwise negative Objective:   Physical Exam  Well-developed well-nourished male in no acute distress ppm sitting position 160/100      Assessment & Plan:  Hypertension not at goal continue Tenoretic and Norvasc 5 mg daily

## 2012-06-06 NOTE — Patient Instructions (Signed)
Continue the Tenoretic one tablet daily in the morning  Add Norvasc 5 mg,,,,,,,,,,,, one tablet daily in the morning  Check a blood pressure daily in the morning and keep a log of all your blood pressure readings  Return in 4 weeks for followup with the data and the device  Increase the Chantix to one half tab twice daily  Begin to taper as outlined  Followup in 4 weeks

## 2012-07-04 ENCOUNTER — Ambulatory Visit: Payer: Managed Care, Other (non HMO) | Admitting: Family Medicine

## 2012-07-28 ENCOUNTER — Ambulatory Visit: Payer: Managed Care, Other (non HMO) | Admitting: Family Medicine

## 2012-08-11 ENCOUNTER — Encounter: Payer: Self-pay | Admitting: Family Medicine

## 2012-08-11 ENCOUNTER — Ambulatory Visit (INDEPENDENT_AMBULATORY_CARE_PROVIDER_SITE_OTHER): Payer: Managed Care, Other (non HMO) | Admitting: Family Medicine

## 2012-08-11 VITALS — BP 130/90 | Temp 98.8°F | Wt 187.0 lb

## 2012-08-11 DIAGNOSIS — I10 Essential (primary) hypertension: Secondary | ICD-10-CM

## 2012-08-11 DIAGNOSIS — F17200 Nicotine dependence, unspecified, uncomplicated: Secondary | ICD-10-CM

## 2012-08-11 NOTE — Progress Notes (Signed)
  Subjective:    Patient ID: Jeffrey Wagner, male    DOB: July 25, 1955, 57 y.o.   MRN: [id number]  HPI Caulder 57 year old married male who is in the process of weaning off cigarettes by taking a half of a 1 mg Chantix tablet daily. He's down to 2 cigarettes per day.  He comes in today for evaluation of hypertension. Current medication is Norvasc 5 mg daily and Tenoretic 50-25 daily BP 130/90 no side effects to medication   Review of Systems    review of systems cardiac-wise negative Objective:   Physical Exam Well-developed well-nourished male no acute distress BP right arm sitting position 130/90       Assessment & Plan:  Hypertension at goal continue current therapy check BP weekly followup in December for annual exam  Tobacco abuse continue Chantix one half tab daily stop smoking completely

## 2012-08-11 NOTE — Patient Instructions (Signed)
Continue your current medications  Stop smoking completely  Take the Chantix one half tab daily for 2 months after you stop smoking completely then stop the Chantix however if you feel like you need to restart the Chantix then it's okay to restart it,,,,,,,,,, one half tab daily for 2 months  Followup in December for your annual physical exam labs one week prior

## 2013-05-01 ENCOUNTER — Other Ambulatory Visit: Payer: Managed Care, Other (non HMO)

## 2013-05-08 ENCOUNTER — Encounter: Payer: Managed Care, Other (non HMO) | Admitting: Family Medicine

## 2013-10-13 ENCOUNTER — Other Ambulatory Visit: Payer: Managed Care, Other (non HMO)

## 2013-10-23 ENCOUNTER — Encounter: Payer: Self-pay | Admitting: Family Medicine

## 2013-10-23 ENCOUNTER — Ambulatory Visit (INDEPENDENT_AMBULATORY_CARE_PROVIDER_SITE_OTHER): Payer: Managed Care, Other (non HMO) | Admitting: Family Medicine

## 2013-10-23 VITALS — BP 110/80 | Temp 98.5°F | Ht 69.0 in | Wt 177.0 lb

## 2013-10-23 DIAGNOSIS — I10 Essential (primary) hypertension: Secondary | ICD-10-CM

## 2013-10-23 DIAGNOSIS — F528 Other sexual dysfunction not due to a substance or known physiological condition: Secondary | ICD-10-CM

## 2013-10-23 DIAGNOSIS — F17200 Nicotine dependence, unspecified, uncomplicated: Secondary | ICD-10-CM

## 2013-10-23 LAB — CBC WITH DIFFERENTIAL/PLATELET
BASOS PCT: 0.7 % (ref 0.0–3.0)
Basophils Absolute: 0 10*3/uL (ref 0.0–0.1)
EOS PCT: 1.8 % (ref 0.0–5.0)
Eosinophils Absolute: 0.1 10*3/uL (ref 0.0–0.7)
HCT: 34.1 % — ABNORMAL LOW (ref 39.0–52.0)
HEMOGLOBIN: 11.2 g/dL — AB (ref 13.0–17.0)
Lymphocytes Relative: 50.4 % — ABNORMAL HIGH (ref 12.0–46.0)
Lymphs Abs: 2.3 10*3/uL (ref 0.7–4.0)
MCHC: 32.9 g/dL (ref 30.0–36.0)
MCV: 101.3 fl — AB (ref 78.0–100.0)
MONO ABS: 0.3 10*3/uL (ref 0.1–1.0)
Monocytes Relative: 6.5 % (ref 3.0–12.0)
NEUTROS PCT: 40.6 % — AB (ref 43.0–77.0)
Neutro Abs: 1.8 10*3/uL (ref 1.4–7.7)
Platelets: 210 10*3/uL (ref 150.0–400.0)
RBC: 3.37 Mil/uL — AB (ref 4.22–5.81)
RDW: 11.7 % (ref 11.5–15.5)
WBC: 4.5 10*3/uL (ref 4.0–10.5)

## 2013-10-23 LAB — POCT URINALYSIS DIPSTICK
Blood, UA: NEGATIVE
GLUCOSE UA: NEGATIVE
KETONES UA: NEGATIVE
Leukocytes, UA: NEGATIVE
Nitrite, UA: NEGATIVE
Urobilinogen, UA: 1
pH, UA: 5.5

## 2013-10-23 LAB — BASIC METABOLIC PANEL
BUN: 14 mg/dL (ref 6–23)
CO2: 34 meq/L — AB (ref 19–32)
Calcium: 9.1 mg/dL (ref 8.4–10.5)
Chloride: 101 mEq/L (ref 96–112)
Creatinine, Ser: 0.7 mg/dL (ref 0.4–1.5)
GFR: 152.61 mL/min (ref >60.00)
GLUCOSE: 103 mg/dL — AB (ref 70–99)
POTASSIUM: 3.3 meq/L — AB (ref 3.5–5.1)
SODIUM: 139 meq/L (ref 135–145)

## 2013-10-23 LAB — HEPATIC FUNCTION PANEL
ALT: 16 U/L (ref 0–53)
AST: 20 U/L (ref 0–37)
Albumin: 4.1 g/dL (ref 3.5–5.2)
Alkaline Phosphatase: 56 U/L (ref 39–117)
BILIRUBIN DIRECT: 0.2 mg/dL (ref 0.0–0.3)
BILIRUBIN TOTAL: 1.2 mg/dL (ref 0.2–1.2)
TOTAL PROTEIN: 6.9 g/dL (ref 6.0–8.3)

## 2013-10-23 LAB — PSA: PSA: 0.92 ng/mL (ref 0.10–4.00)

## 2013-10-23 LAB — LIPID PANEL
CHOL/HDL RATIO: 4
Cholesterol: 199 mg/dL (ref 0–200)
HDL: 46.6 mg/dL (ref >39.00)
LDL CALC: 136 mg/dL — AB (ref 0–99)
NONHDL: 152.4
TRIGLYCERIDES: 83 mg/dL (ref 0.0–149.0)
VLDL: 16.6 mg/dL (ref 0.0–40.0)

## 2013-10-23 LAB — TSH: TSH: 0.72 u[IU]/mL (ref 0.35–4.50)

## 2013-10-23 MED ORDER — ATENOLOL-CHLORTHALIDONE 50-25 MG PO TABS: ORAL_TABLET | ORAL | Status: DC

## 2013-10-23 MED ORDER — SILDENAFIL CITRATE 100 MG PO TABS: 50.0000 mg | ORAL_TABLET | Freq: Every day | ORAL | Status: DC | PRN

## 2013-10-23 MED ORDER — AMLODIPINE BESYLATE 5 MG PO TABS: 5.0000 mg | ORAL_TABLET | Freq: Every day | ORAL | Status: DC

## 2013-10-23 MED ORDER — VARENICLINE TARTRATE 1 MG PO TABS: ORAL_TABLET | ORAL | Status: DC

## 2013-10-23 NOTE — Progress Notes (Signed)
   Subjective:    Patient ID: Jeffrey Wagner, male    DOB: 01/06/1956, 58 y.o.   MRN: [id number]  HPI Jeffrey Wagner is a 58 year old married male smoker who says he is down to one pack a day. He's tried the Chantix program but then who quit the medication and start smoking again. We talked about various strategies to improve that and get off tobacco completely. Last chest x-Jadyn 4 years ago  He takes Norvasc and Tenoretic for hypertension BP 110/80  He takes an aspirin tablet daily and Viagra when necessary for ED. He gets routine dental care, hospital couple years ago normal. He's not had an eye exam in many years. Recommended Dr. Talbert Forest   Review of Systems  Constitutional: Negative.   HENT: Negative.   Eyes: Negative.   Respiratory: Negative.   Cardiovascular: Negative.   Gastrointestinal: Negative.   Genitourinary: Negative.   Musculoskeletal: Negative.   Skin: Negative.   Neurological: Negative.   Psychiatric/Behavioral: Negative.        Objective:   Physical Exam  Nursing note and vitals reviewed. Constitutional: He is oriented to person, place, and time. He appears well-developed and well-nourished.  HENT:  Head: Normocephalic and atraumatic.  Right Ear: External ear normal.  Left Ear: External ear normal.  Nose: Nose normal.  Mouth/Throat: Oropharynx is clear and moist.  Eyes: Conjunctivae and EOM are normal. Pupils are equal, round, and reactive to light.  Neck: Normal range of motion. Neck supple. No JVD present. No tracheal deviation present. No thyromegaly present.  Cardiovascular: Normal rate, regular rhythm, normal heart sounds and intact distal pulses.  Exam reveals no gallop and no friction rub.   No murmur heard. No carotid no bruits peripheral pulses 1+ and symmetrical  Pulmonary/Chest: Effort normal and breath sounds normal. No stridor. No respiratory distress. He has no wheezes. He has no rales. He exhibits no tenderness.  Abdominal: Soft. Bowel sounds are normal. He  exhibits no distension and no mass. There is no tenderness. There is no rebound and no guarding.  Genitourinary: Rectum normal and penis normal. Guaiac negative stool. No penile tenderness.  1+ symmetrical BPH  Musculoskeletal: Normal range of motion. He exhibits no edema and no tenderness.  Lymphadenopathy:    He has no cervical adenopathy.  Neurological: He is alert and oriented to person, place, and time. He has normal reflexes. No cranial nerve deficit. He exhibits normal muscle tone.  Skin: Skin is warm and dry. No rash noted. No erythema. No pallor.  Psychiatric: He has a normal mood and affect. His behavior is normal. Judgment and thought content normal.          Assessment & Plan:  Healthy male  Hypertension continue current medications  Erectile dysfunction continue Viagra when necessary  Ongoing tobacco abuse,,,,,,,,,,

## 2013-10-23 NOTE — Progress Notes (Signed)
Pre visit review using our clinic review tool, if applicable. No additional management support is needed unless otherwise documented below in the visit note. 

## 2013-10-23 NOTE — Patient Instructions (Signed)
Continue your current medications  Restart the Chantix program......... one half milligram daily  Taper by one cigarette per week  Return in 4-6 weeks for followup

## 2013-10-24 ENCOUNTER — Telehealth: Payer: Self-pay | Admitting: Family Medicine

## 2013-10-24 NOTE — Telephone Encounter (Signed)
Relevant patient education mailed to patient.  

## 2013-10-27 ENCOUNTER — Encounter: Payer: Self-pay | Admitting: *Deleted

## 2013-12-12 ENCOUNTER — Ambulatory Visit (INDEPENDENT_AMBULATORY_CARE_PROVIDER_SITE_OTHER): Payer: Managed Care, Other (non HMO) | Admitting: Family Medicine

## 2013-12-12 ENCOUNTER — Encounter: Payer: Self-pay | Admitting: Family Medicine

## 2013-12-12 VITALS — BP 120/88 | Temp 98.1°F | Wt 181.0 lb

## 2013-12-12 DIAGNOSIS — I10 Essential (primary) hypertension: Secondary | ICD-10-CM

## 2013-12-12 DIAGNOSIS — F528 Other sexual dysfunction not due to a substance or known physiological condition: Secondary | ICD-10-CM

## 2013-12-12 DIAGNOSIS — F17200 Nicotine dependence, unspecified, uncomplicated: Secondary | ICD-10-CM

## 2013-12-12 MED ORDER — SILDENAFIL CITRATE 20 MG PO TABS: 20.0000 mg | ORAL_TABLET | Freq: Three times a day (TID) | ORAL | Status: DC

## 2013-12-12 NOTE — Progress Notes (Signed)
Pre visit review using our clinic review tool, if applicable. No additional management support is needed unless otherwise documented below in the visit note. 

## 2013-12-12 NOTE — Progress Notes (Signed)
   Subjective:    Patient ID: Jeffrey Wagner, male    DOB: [birth year], 58 y.o.   MRN: [id number]  HPI Jeffrey Wagner is a 58 year old married male smoker who comes in today for evaluation of hypertension and tobacco abuse  His blood pressure on Norvasc 5 mg in Tenoretic 50-25 is 120/80  On Chantix one half tab daily he's decreased his cigarette consumption down to 10 cigarettes a day.   Review of Systems Review of systems negative    Objective:   Physical Exam  Well-developed well-nourished thin male no acute distress vital signs stable he is afebrile BP right arm sitting position 120/80      Assessment & Plan:  Hypertension at goal,,,,,,,,,,,, continue current therapy  Tobacco abuse,,,,,,,,,,,, continue Chantix one half tab daily begin tapering by 2 per week quit date in 6 weeks  Erectile dysfunction,,,,,,,,,,,,,, R rebody a

## 2013-12-12 NOTE — Patient Instructions (Signed)
Revatio............Marland Kitchen 1-2 tablets when necessary  Continue your current blood pressure medication  Chantix,,,,,,,,,,,, one half tab daily  Begin tapering by 2 per week,,,,,,,,,,,, starting to normal and he only 8 per day,,,,,,,,,,,,,, then 6,,,,,,,,,,,,, etc.  Date in 6 weeks  Followup here in 6 weeks,

## 2013-12-13 ENCOUNTER — Telehealth: Payer: Self-pay | Admitting: Family Medicine

## 2013-12-13 NOTE — Telephone Encounter (Signed)
Relevant patient education mailed to patient.  

## 2014-01-23 ENCOUNTER — Encounter: Payer: Self-pay | Admitting: Family Medicine

## 2014-01-23 ENCOUNTER — Ambulatory Visit (INDEPENDENT_AMBULATORY_CARE_PROVIDER_SITE_OTHER): Payer: Managed Care, Other (non HMO) | Admitting: Family Medicine

## 2014-01-23 VITALS — BP 130/84 | Temp 98.4°F | Wt 181.0 lb

## 2014-01-23 DIAGNOSIS — F17200 Nicotine dependence, unspecified, uncomplicated: Secondary | ICD-10-CM

## 2014-01-23 NOTE — Patient Instructions (Signed)
Chantix............. one half tablet in the morning............ half a tablet at 5 PM  Decrease by one per week  Followup in 6 weeks  .

## 2014-01-23 NOTE — Progress Notes (Signed)
Pre visit review using our clinic review tool, if applicable. No additional management support is needed unless otherwise documented below in the visit note. 

## 2014-01-23 NOTE — Progress Notes (Signed)
   Subjective:    Patient ID: Jeffrey Wagner, male    DOB: [birth year], 58 y.o.   MRN: [id number]  HPI Jeffrey Wagner is a 58 year old male married smoker,,,,,,,,, down to 10 cigarettes a day on the Chantix program that I outlined for him. He's taken a 0.5 mg daily. He stuck to 10 cigarettes a day   Review of Systems Review of systems otherwise negative    Objective:   Physical Exam   Well-developed well-nourished male no acute distress vital signs stable he is afebrile     Assessment & Plan:  Tobacco abuse......... decrease smoking by 50% on Chantix 0.5 mg daily.......... increase dose to 0.5 twice a day taper by one per week followup in 6 weeks

## 2014-03-05 ENCOUNTER — Ambulatory Visit: Payer: Managed Care, Other (non HMO) | Admitting: Family Medicine

## 2015-09-30 ENCOUNTER — Other Ambulatory Visit: Payer: Self-pay | Admitting: Family Medicine

## 2015-10-02 NOTE — Telephone Encounter (Signed)
Dr. Martinique, pt scheduled to see you 8/22 to establish. Okay to refill medications Amlodipine and Atenolol-Chlorthalidone?

## 2015-10-03 NOTE — Telephone Encounter (Signed)
Last OV 01/2014, last labs in 2015 with HypoK+. I do not feel comfortable authorizing refills w/o evaluation. She could be added to my schedule tomorrow morning (10/04/15) for HTN f/u and labs. Thanks. BJ

## 2015-10-04 NOTE — Telephone Encounter (Addendum)
Recvd refill request. Patient would need to schedule an appt w/ Dr. Martinique. Last OV 12/12/2013 with Dr. Sherren Mocha. Called patient. No answer. Will try later.  This encounter was created in error - please disregard.

## 2015-10-04 NOTE — Telephone Encounter (Signed)
Patient would need to schedule an appt w/ Dr. Martinique. Last OV 12/12/2013 with Dr. Sherren Mocha. Called patient. No answer. Will try later.

## 2015-10-07 NOTE — Telephone Encounter (Signed)
Pt has been scheduled to see Dr Martinique tomorrow to fup on med

## 2015-10-08 ENCOUNTER — Ambulatory Visit (INDEPENDENT_AMBULATORY_CARE_PROVIDER_SITE_OTHER): Payer: Managed Care, Other (non HMO) | Admitting: Family Medicine

## 2015-10-08 ENCOUNTER — Encounter: Payer: Self-pay | Admitting: Family Medicine

## 2015-10-08 VITALS — BP 160/85 | HR 60 | Temp 98.1°F | Resp 12 | Ht 69.0 in | Wt 182.3 lb

## 2015-10-08 DIAGNOSIS — G8929 Other chronic pain: Secondary | ICD-10-CM | POA: Diagnosis not present

## 2015-10-08 DIAGNOSIS — R739 Hyperglycemia, unspecified: Secondary | ICD-10-CM | POA: Diagnosis not present

## 2015-10-08 DIAGNOSIS — E876 Hypokalemia: Secondary | ICD-10-CM

## 2015-10-08 DIAGNOSIS — R351 Nocturia: Secondary | ICD-10-CM

## 2015-10-08 DIAGNOSIS — D649 Anemia, unspecified: Secondary | ICD-10-CM | POA: Diagnosis not present

## 2015-10-08 DIAGNOSIS — I10 Essential (primary) hypertension: Secondary | ICD-10-CM

## 2015-10-08 DIAGNOSIS — M549 Dorsalgia, unspecified: Secondary | ICD-10-CM | POA: Diagnosis not present

## 2015-10-08 LAB — COMPREHENSIVE METABOLIC PANEL
ALBUMIN: 4.2 g/dL (ref 3.5–5.2)
ALK PHOS: 65 U/L (ref 39–117)
ALT: 12 U/L (ref 0–53)
AST: 15 U/L (ref 0–37)
BUN: 12 mg/dL (ref 6–23)
CO2: 32 mEq/L (ref 19–32)
CREATININE: 0.68 mg/dL (ref 0.40–1.50)
Calcium: 9.6 mg/dL (ref 8.4–10.5)
Chloride: 102 mEq/L (ref 96–112)
GFR: 154.13 mL/min (ref >60.00)
Glucose, Bld: 95 mg/dL (ref 70–99)
Potassium: 4 mEq/L (ref 3.5–5.1)
Sodium: 139 mEq/L (ref 135–145)
TOTAL PROTEIN: 7.1 g/dL (ref 6.0–8.3)
Total Bilirubin: 0.7 mg/dL (ref 0.2–1.2)

## 2015-10-08 LAB — CBC WITH DIFFERENTIAL/PLATELET
BASOS ABS: 0 10*3/uL (ref 0.0–0.1)
Basophils Relative: 0.7 % (ref 0.0–3.0)
EOS ABS: 0.1 10*3/uL (ref 0.0–0.7)
Eosinophils Relative: 1.4 % (ref 0.0–5.0)
HEMATOCRIT: 37.7 % — AB (ref 39.0–52.0)
HEMOGLOBIN: 12.5 g/dL — AB (ref 13.0–17.0)
Lymphocytes Relative: 43 % (ref 12.0–46.0)
Lymphs Abs: 2.1 10*3/uL (ref 0.7–4.0)
MCHC: 33.3 g/dL (ref 30.0–36.0)
MCV: 99.2 fl (ref 78.0–100.0)
MONOS PCT: 7.8 % (ref 3.0–12.0)
Monocytes Absolute: 0.4 10*3/uL (ref 0.1–1.0)
NEUTROS ABS: 2.3 10*3/uL (ref 1.4–7.7)
Neutrophils Relative %: 47.1 % (ref 43.0–77.0)
PLATELETS: 244 10*3/uL (ref 150.0–400.0)
RBC: 3.8 Mil/uL — AB (ref 4.22–5.81)
RDW: 12.5 % (ref 11.5–15.5)
WBC: 4.8 10*3/uL (ref 4.0–10.5)

## 2015-10-08 LAB — URINALYSIS, MICROSCOPIC ONLY
RBC / HPF: NONE SEEN (ref 0–?)
WBC UA: NONE SEEN (ref 0–?)

## 2015-10-08 LAB — PSA: PSA: 1.03 ng/mL (ref 0.10–4.00)

## 2015-10-08 LAB — HEMOGLOBIN A1C: Hgb A1c MFr Bld: 5.5 % (ref 4.6–6.5)

## 2015-10-08 MED ORDER — AMLODIPINE BESYLATE 5 MG PO TABS: 5.0000 mg | ORAL_TABLET | Freq: Every day | ORAL | Status: DC

## 2015-10-08 MED ORDER — ATENOLOL-CHLORTHALIDONE 50-25 MG PO TABS: ORAL_TABLET | ORAL | Status: DC

## 2015-10-08 NOTE — Patient Instructions (Addendum)
A few things to remember from today's visit:   1. Essential hypertension  - Comprehensive metabolic panel - amLODipine (NORVASC) 5 MG tablet; Take 1 tablet (5 mg total) by mouth daily.  Dispense: 90 tablet; Refill: 2 - atenolol-chlorthalidone (TENORETIC) 50-25 MG tablet; 1 by mouth every morning  Dispense: 30 tablet; Refill: 3  2. Hyperglycemia  - Comprehensive metabolic panel - Hemoglobin A1c  3. Anemia, unspecified  - Comprehensive metabolic panel - CBC with Differential/Platelet  4. Chronic back pain  - CBC with Differential/Platelet - PSA  5. Hypokalemia  - Comprehensive metabolic panel  6. Nocturia  - Urine Microscopic Only - PSA     Blood pressure goal for most people is less than 140/90.Some populations (older than 60) the goal is less than 150/90.  Elevated blood pressure increases the risk of strokes, heart and kidney disease, and eye problems. Regular physical activity and a healthy diet (DASH diet) usually help. Low salt diet. Take medications as instructed. Caution with some over the counter medications as cold medications, dietary products (for weight loss), and Ibuprofen or Aleve (frequent use);all these medications could cause elevation of blood pressure.  DASH diet recommended: high in vegetables, fruits, low-fat dairy products, whole grains, poultry, fish, and nuts; and low in sweets, sugar-sweetened beverages, and red meats.      If you sign-up for My chart, you can communicate easier with [place] in case you have any question or concern.

## 2015-10-08 NOTE — Progress Notes (Signed)
Pre visit review using our clinic review tool, if applicable. No additional management support is needed unless otherwise documented below in the visit note. 
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
## 2015-10-09 ENCOUNTER — Telehealth: Payer: Self-pay

## 2015-10-09 NOTE — Telephone Encounter (Signed)
Ok great. Thanks!

## 2015-10-09 NOTE — Telephone Encounter (Signed)
Called patient. No answer. Will try later.  

## 2015-10-09 NOTE — Telephone Encounter (Signed)
-----   Message from Betty G Martinique, MD sent at 10/09/2015  1:41 PM EDT ----- Liver and renal function normal. No diabetes. Urine and PSA normal. Minimal anemia, stable. Thanks!

## 2015-10-10 ENCOUNTER — Telehealth: Payer: Self-pay

## 2015-10-10 NOTE — Telephone Encounter (Signed)
-----   Message from Betty G Martinique, MD sent at 10/09/2015  1:41 PM EDT ----- Liver and renal function normal. No diabetes. Urine and PSA normal. Minimal anemia, stable. Thanks!

## 2015-10-11 NOTE — Telephone Encounter (Signed)
This encounter was created in error - please disregard.

## 2015-10-11 NOTE — Telephone Encounter (Signed)
Tried to reach patient. No answer.  

## 2015-10-11 NOTE — Addendum Note (Signed)
Addended by: Milta Deiters on: 10/11/2015 09:03 AM   Modules accepted: Level of Service, SmartSet

## 2015-10-15 NOTE — Telephone Encounter (Signed)
This encounter was created in error - please disregard.

## 2015-10-15 NOTE — Telephone Encounter (Signed)
Tried to reach patient. No answer. Sent letter.

## 2015-10-22 ENCOUNTER — Telehealth: Payer: Self-pay | Admitting: Family Medicine

## 2015-10-22 NOTE — Telephone Encounter (Signed)
Informed pt of results and pt verbalized understanding.

## 2015-10-22 NOTE — Telephone Encounter (Signed)
Pt received a letter that we have tried unsuccessfully to contact him to give him his lab results. Pt would like a call back for those results.

## 2015-11-12 ENCOUNTER — Other Ambulatory Visit: Payer: Self-pay

## 2015-11-12 DIAGNOSIS — I10 Essential (primary) hypertension: Secondary | ICD-10-CM

## 2015-11-12 MED ORDER — ATENOLOL-CHLORTHALIDONE 50-25 MG PO TABS: ORAL_TABLET | ORAL | Status: DC

## 2015-11-13 ENCOUNTER — Other Ambulatory Visit: Payer: Self-pay | Admitting: *Deleted

## 2015-11-13 DIAGNOSIS — I10 Essential (primary) hypertension: Secondary | ICD-10-CM

## 2015-11-13 MED ORDER — ATENOLOL-CHLORTHALIDONE 50-25 MG PO TABS: ORAL_TABLET | ORAL | Status: DC

## 2015-12-04 ENCOUNTER — Encounter: Payer: Self-pay | Admitting: Family Medicine

## 2015-12-04 ENCOUNTER — Ambulatory Visit (INDEPENDENT_AMBULATORY_CARE_PROVIDER_SITE_OTHER): Payer: Managed Care, Other (non HMO) | Admitting: Family Medicine

## 2015-12-04 VITALS — BP 146/84 | HR 62 | Temp 98.7°F | Ht 69.0 in | Wt 181.7 lb

## 2015-12-04 DIAGNOSIS — G8929 Other chronic pain: Secondary | ICD-10-CM | POA: Diagnosis not present

## 2015-12-04 DIAGNOSIS — M545 Low back pain: Secondary | ICD-10-CM | POA: Diagnosis not present

## 2015-12-04 MED ORDER — CYCLOBENZAPRINE HCL 10 MG PO TABS: 10.0000 mg | ORAL_TABLET | Freq: Three times a day (TID) | ORAL | 2 refills | Status: DC | PRN

## 2015-12-04 NOTE — Progress Notes (Signed)
   Subjective:    Patient ID: Jeffrey Wagner, male    DOB: [birth year], 60 y.o.   MRN: [id number]  HPI Here for another flare up of low back pain. He has known osteoarthritis of the spine and he takes Aleve for this sometimes. However he often feels stiffness of the back and has trouble getting out of bed in the mornings. He tried a muscle relaxer from a friend and this helped. No radiation of pain to the legs.    Review of Systems  Constitutional: Negative.   Musculoskeletal: Positive for back pain, neck pain and neck stiffness. Negative for joint swelling.  Neurological: Negative for weakness and numbness.       Objective:   Physical Exam  Constitutional: He appears well-developed and well-nourished.  Cardiovascular: Normal rate, regular rhythm, normal heart sounds and intact distal pulses.   Pulmonary/Chest: Effort normal and breath sounds normal.  Musculoskeletal:  He is tender on both sides of the lower back with some spasm. ROM is mildly reduced.          Assessment & Plan:  Low back pain. Wrote for Flexeril 10 mg to use prn. Written out of work yesterday and today.  Laurey Morale, MD

## 2015-12-04 NOTE — Progress Notes (Signed)
Pre visit review using our clinic review tool, if applicable. No additional management support is needed unless otherwise documented below in the visit note. 

## 2015-12-11 ENCOUNTER — Encounter: Payer: Self-pay | Admitting: Family Medicine

## 2015-12-11 ENCOUNTER — Ambulatory Visit (INDEPENDENT_AMBULATORY_CARE_PROVIDER_SITE_OTHER): Payer: Managed Care, Other (non HMO) | Admitting: Family Medicine

## 2015-12-11 VITALS — BP 118/78 | HR 79 | Resp 12 | Ht 69.0 in | Wt 184.4 lb

## 2015-12-11 DIAGNOSIS — Z1159 Encounter for screening for other viral diseases: Secondary | ICD-10-CM

## 2015-12-11 DIAGNOSIS — Z114 Encounter for screening for human immunodeficiency virus [HIV]: Secondary | ICD-10-CM | POA: Diagnosis not present

## 2015-12-11 DIAGNOSIS — Z13220 Encounter for screening for lipoid disorders: Secondary | ICD-10-CM

## 2015-12-11 DIAGNOSIS — Z0000 Encounter for general adult medical examination without abnormal findings: Secondary | ICD-10-CM | POA: Diagnosis not present

## 2015-12-11 DIAGNOSIS — F17200 Nicotine dependence, unspecified, uncomplicated: Secondary | ICD-10-CM

## 2015-12-11 DIAGNOSIS — Z1211 Encounter for screening for malignant neoplasm of colon: Secondary | ICD-10-CM

## 2015-12-11 DIAGNOSIS — I10 Essential (primary) hypertension: Secondary | ICD-10-CM

## 2015-12-11 LAB — BASIC METABOLIC PANEL
BUN: 13 mg/dL (ref 6–23)
CHLORIDE: 101 meq/L (ref 96–112)
CO2: 29 mEq/L (ref 19–32)
CREATININE: 0.72 mg/dL (ref 0.40–1.50)
Calcium: 9.3 mg/dL (ref 8.4–10.5)
GFR: 144.2 mL/min (ref >60.00)
GLUCOSE: 138 mg/dL — AB (ref 70–99)
Potassium: 3.5 mEq/L (ref 3.5–5.1)
Sodium: 138 mEq/L (ref 135–145)

## 2015-12-11 LAB — LIPID PANEL
CHOL/HDL RATIO: 5
Cholesterol: 231 mg/dL — ABNORMAL HIGH (ref 0–200)
HDL: 51.1 mg/dL (ref >39.00)
LDL CALC: 165 mg/dL — AB (ref 0–99)
NonHDL: 179.44
TRIGLYCERIDES: 74 mg/dL (ref 0.0–149.0)
VLDL: 14.8 mg/dL (ref 0.0–40.0)

## 2015-12-11 LAB — MICROALBUMIN / CREATININE URINE RATIO
CREATININE, U: 199.1 mg/dL
Microalb Creat Ratio: 0.4 mg/g (ref 0.0–30.0)
Microalb, Ur: 0.7 mg/dL (ref 0.0–1.9)

## 2015-12-11 MED ORDER — NICOTINE 21 MG/24HR TD PT24: 21.0000 mg | MEDICATED_PATCH | Freq: Every day | TRANSDERMAL | 0 refills | Status: DC

## 2015-12-11 NOTE — Progress Notes (Signed)
Pre visit review using our clinic review tool, if applicable. No additional management support is needed unless otherwise documented below in the visit note. 

## 2015-12-11 NOTE — Progress Notes (Signed)
HPI:   Mr.Sylvan Thong is a 60 y.o. male, who is here today for follow up, last OV 10/08/15, when his BP was elevated. He would like to have his routine physical today.   Former PCP: Dr Sherren Mocha Last preventive routine visit: over a year ago. He lives with his wife. He doesn't follow a consistent healthy diet and he does not exercise periodically.  He is been seen recently for back pain (12/04/15), reporting improvement. He seems to be exacerbated by some activities at work, lifting and bending. He is taking Flexeril daily as needed.  History of hypertension, lab work done in June 2017. Denies severe/frequent headache, visual changes, chest pain, dyspnea, palpitation, claudication, focal weakness, or edema. No hearing loss, he has annual hearing screen through work.  He denies alcohol abuse, he tells me that he drinks beer, a 6 pack about every 2 weeks. He still smoking, has been smoking for about 15-17 years, half PPD. Tried Chantix before, did not tolerate well, caused nightmares. He denies illicit drug use.   Concerns today: None.   Lab Results  Component Value Date   CHOL 199 10/23/2013   HDL 46.60 10/23/2013   LDLCALC 136 (H) 10/23/2013   LDLDIRECT 143.3 11/24/2010   TRIG 83.0 10/23/2013   CHOLHDL 4 10/23/2013    Lab Results  Component Value Date   CREATININE 0.68 10/08/2015   BUN 12 10/08/2015   NA 139 10/08/2015   K 4.0 10/08/2015   CL 102 10/08/2015   CO2 32 10/08/2015    Lab Results  Component Value Date   WBC 4.8 10/08/2015   HGB 12.5 (L) 10/08/2015   HCT 37.7 (L) 10/08/2015   MCV 99.2 10/08/2015   PLT 244.0 10/08/2015   Lab Results  Component Value Date   PSA 1.03 10/08/2015   PSA 0.92 10/23/2013   PSA 1.01 05/02/2012     Review of Systems  Constitutional: Negative for activity change, appetite change, fatigue, fever and unexpected weight change.  HENT: Negative for dental problem, nosebleeds, sore throat, trouble swallowing and voice  change.   Eyes: Negative for redness and visual disturbance.  Respiratory: Negative for apnea, cough, shortness of breath and wheezing.   Cardiovascular: Negative for chest pain, palpitations and leg swelling.  Gastrointestinal: Negative for abdominal pain, blood in stool, nausea and vomiting.       No changes in bowel habits.  Endocrine: Negative for polydipsia and polyuria.  Genitourinary: Negative for decreased urine volume, dysuria, genital sores, hematuria and testicular pain.  Musculoskeletal: Positive for back pain. Negative for arthralgias, joint swelling and myalgias.  Skin: Negative for color change and rash.  Neurological: Negative for dizziness, seizures, syncope, weakness, numbness and headaches.  Hematological: Negative for adenopathy. Does not bruise/bleed easily.  Psychiatric/Behavioral: Negative for confusion and sleep disturbance. The patient is not nervous/anxious.       Current Outpatient Prescriptions on File Prior to Visit  Medication Sig Dispense Refill  . amLODipine (NORVASC) 5 MG tablet Take 1 tablet (5 mg total) by mouth daily. 90 tablet 2  . aspirin 81 MG tablet Take 81 mg by mouth daily.      Marland Kitchen atenolol-chlorthalidone (TENORETIC) 50-25 MG tablet 1 by mouth every morning 90 tablet 1  . cyclobenzaprine (FLEXERIL) 10 MG tablet Take 1 tablet (10 mg total) by mouth 3 (three) times daily as needed for muscle spasms. 60 tablet 2   No current facility-administered medications on file prior to visit.  Past Medical History:  Diagnosis Date  . Arthritis   . Hypertension    No Known Allergies  No family history on file.  No contributory.   Social History   Social History  . Marital status: Married    Spouse name: N/A  . Number of children: N/A  . Years of education: N/A   Social History Main Topics  . Smoking status: Current Every Day Smoker    Years: 35.00    Types: Cigarettes  . Smokeless tobacco: Never Used     Comment: 10 cigarettes per day    . Alcohol use 3.6 oz/week    6 Cans of beer per week     Comment: q 2 weeks  . Drug use: No  . Sexual activity: Yes   Other Topics Concern  . None   Social History Narrative  . None    Vitals:   12/11/15 0751  BP: 118/78  Pulse: 79  Resp: 12    Body mass index is 27.23 kg/m.   O2 sat at RA 97%.   Physical Exam  Nursing note and vitals reviewed. Constitutional: He is oriented to person, place, and time. He appears well-developed and well-nourished. No distress.  HENT:  Head: Atraumatic.  Right Ear: Hearing and external ear normal.  Left Ear: Hearing, tympanic membrane, external ear and ear canal normal.  Mouth/Throat: Oropharynx is clear and moist and mucous membranes are normal.  Right cerumen excess, not able to see TM  Eyes: Conjunctivae and EOM are normal. Pupils are equal, round, and reactive to light.  Neck: Normal range of motion. No tracheal deviation present. No thyromegaly present.  Cardiovascular: Normal rate and regular rhythm.   No murmur heard. Pulses:      Dorsalis pedis pulses are 2+ on the right side, and 2+ on the left side.  Respiratory: Effort normal and breath sounds normal. No respiratory distress.  GI: Soft. He exhibits no mass. There is no hepatomegaly. There is no tenderness.  Genitourinary:  Genitourinary Comments: Refused, no concerns.  Musculoskeletal: He exhibits no edema or tenderness.  Mild deformities IP joints fingers and toes bilateral, L hand >R appreciated and no signs of synovitis.  Lymphadenopathy:    He has no cervical adenopathy.       Right: No supraclavicular adenopathy present.       Left: No supraclavicular adenopathy present.  Neurological: He is alert and oriented to person, place, and time. He has normal strength. No cranial nerve deficit or sensory deficit. Coordination and gait normal.  Skin: Skin is warm. No erythema.  Psychiatric: He has a normal mood and affect.  Well groomed,Good eye contact.       ASSESSMENT AND PLAN:     Marsalis was seen today for transfer.  Diagnoses and all orders for this visit:  Routine physical examination    We discussed the importance of regular physical activity and healthy diet for prevention of chronic illness and/or complications. Preventive guidelines reviewed.  Next CPE in 1 year.  -     Hep C Antibody -     Lipid panel -     HIV antibody (with reflex) -     Microalbumin/Creatinine Ratio, Urine -     Basic metabolic panel  TOBACCO ABUSE  Adverse effects discussed, encouraged to quit. After discussion of some treatment options, he would like to try nicotine patches. Some side effects discussed.  -     nicotine (EQ NICOTINE) 21 mg/24hr patch; Place 1 patch (21  mg total) onto the skin daily.  Screening for HIV (human immunodeficiency virus) -     HIV antibody (with reflex)  Lipid screening -     Lipid panel  Need for hepatitis C screening test -     Hep C Antibody  Essential hypertension  Adequately controlled. No changes in current management. DASH diet recommended. Eye exam recommended annually. F/U in 6 months, before if needed.  -     Microalbumin/Creatinine Ratio, Urine -     Basic metabolic panel  Colon cancer screening  appt with GI will be arranged.              Berlyn Malina G. Martinique, MD  [facility]. [place] office.

## 2015-12-11 NOTE — Patient Instructions (Addendum)
A few things to remember from today's visit:   Essential hypertension  Routine physical examination - Plan: nicotine (EQ NICOTINE) 21 mg/24hr patch, Hep C Antibody, Lipid panel, HIV antibody (with reflex), Microalbumin/Creatinine Ratio, Urine, Basic metabolic panel  TOBACCO ABUSE  Screening for HIV (human immunodeficiency virus)  Lipid screening  Need for hepatitis C screening test    At least 150 minutes of moderate exercise per week, daily brisk walking for 15-30 min is a good exercise option. Healthy diet low in saturated (animal) fats and sweets and consisting of fresh fruits and vegetables, lean meats such as fish and white chicken and whole grains.  - Vaccines:  Tdap vaccine every 10 years.  Shingles vaccine recommended at age 83, could be given after 58 years of age but not sure about insurance coverage.  Pneumonia vaccines:  Prevnar 91 at 71 and Pneumovax at 38.   -Screening recommendations for low/normal risk males:  Screening for diabetes at age 84-45 and every 3 years.    Lipid screening at 35 and every 3 years.   Colonoscopy for colon cancer screening at age 1 and until age 28.  Prostate cancer screening: some controversy.    Colonoscopy will be arrange and at age 17 abdominal aneurysm screening.     Please be sure medication list is accurate. If a new problem present, please set up appointment sooner than planned today.

## 2015-12-12 LAB — HIV ANTIBODY (ROUTINE TESTING W REFLEX): HIV: NONREACTIVE

## 2015-12-12 LAB — HEPATITIS C ANTIBODY: HCV AB: NEGATIVE

## 2015-12-16 ENCOUNTER — Other Ambulatory Visit: Payer: Self-pay

## 2015-12-16 MED ORDER — ATORVASTATIN CALCIUM 20 MG PO TABS: 20.0000 mg | ORAL_TABLET | Freq: Every day | ORAL | 0 refills | Status: DC

## 2015-12-24 ENCOUNTER — Ambulatory Visit: Payer: Managed Care, Other (non HMO) | Admitting: Family Medicine

## 2016-03-12 ENCOUNTER — Other Ambulatory Visit: Payer: Self-pay | Admitting: Family Medicine

## 2016-05-26 ENCOUNTER — Other Ambulatory Visit: Payer: Self-pay | Admitting: Family Medicine

## 2016-05-26 DIAGNOSIS — I10 Essential (primary) hypertension: Secondary | ICD-10-CM

## 2016-06-24 ENCOUNTER — Ambulatory Visit (INDEPENDENT_AMBULATORY_CARE_PROVIDER_SITE_OTHER): Payer: 59 | Admitting: Family Medicine

## 2016-06-24 ENCOUNTER — Encounter: Payer: Self-pay | Admitting: Family Medicine

## 2016-06-24 VITALS — BP 148/80 | HR 75 | Temp 98.4°F | Ht 69.0 in | Wt 194.0 lb

## 2016-06-24 DIAGNOSIS — B349 Viral infection, unspecified: Secondary | ICD-10-CM

## 2016-06-24 NOTE — Progress Notes (Signed)
Subjective:     Patient ID: Jeffrey Wagner, male   DOB: [birth year], 61 y.o.   MRN: [id number]  HPI Patient seen for acute illness. Onset Saturday of low-grade fever, nasal congestion, cough and mild sore throat. Denied much in the way of body aches. Minimal headache. He thinks his fever broke last night. No known sick contacts. Denied any nausea, vomiting, or diarrhea. He feels better today overall. He is requesting note to go back to work tomorrow. He missed yesterday and today.  Past Medical History:  Diagnosis Date  . Arthritis   . Hypertension    History reviewed. No pertinent surgical history.  reports that he has been smoking Cigarettes.  He has smoked for the past 35.00 years. He has never used smokeless tobacco. He reports that he drinks about 3.6 oz of alcohol per week . He reports that he does not use drugs. family history is not on file. No Known Allergies   Review of Systems  Constitutional: Positive for fatigue. Negative for chills and fever.  HENT: Positive for congestion and sore throat.   Respiratory: Positive for cough.        Objective:   Physical Exam  Constitutional: He appears well-developed and well-nourished.  HENT:  Right Ear: External ear normal.  Left Ear: External ear normal.  Mouth/Throat: Oropharyngeal exudate present.  Neck: Neck supple.  Cardiovascular: Normal rate and regular rhythm.   Pulmonary/Chest: Effort normal and breath sounds normal. No respiratory distress. He has no wheezes. He has no rales.  Lymphadenopathy:    He has no cervical adenopathy.       Assessment:     Viral syndrome. Possible influenza but resolving.  Patient nontoxic and nonfocal exam.    Plan:     -Continue plenty of rest and fluids -Work note written for 2/20 and 06/24/16. Return to work tomorrow. -Follow-up for any recurrent fever or other concerns  Eulas Post MD [facility]

## 2016-06-24 NOTE — Patient Instructions (Signed)
Viral Illness, Adult Viruses are tiny germs that can get into a person's body and cause illness. There are many different types of viruses, and they cause many types of illness. Viral illnesses can range from mild to severe. They can affect various parts of the body. Common illnesses that are caused by a virus include colds and the flu. Viral illnesses also include serious conditions such as HIV/AIDS (human immunodeficiency virus/acquired immunodeficiency syndrome). A few viruses have been linked to certain cancers. What are the causes? Many types of viruses can cause illness. Viruses invade cells in your body, multiply, and cause the infected cells to malfunction or die. When the cell dies, it releases more of the virus. When this happens, you develop symptoms of the illness, and the virus continues to spread to other cells. If the virus takes over the function of the cell, it can cause the cell to divide and grow out of control, as is the case when a virus causes cancer. Different viruses get into the body in different ways. You can get a virus by:  Swallowing food or water that is contaminated with the virus.  Breathing in droplets that have been coughed or sneezed into the air by an infected person.  Touching a surface that has been contaminated with the virus and then touching your eyes, nose, or mouth.  Being bitten by an insect or animal that carries the virus.  Having sexual contact with a person who is infected with the virus.  Being exposed to blood or fluids that contain the virus, either through an open cut or during a transfusion. If a virus enters your body, your body's defense system (immune system) will try to fight the virus. You may be at higher risk for a viral illness if your immune system is weak. What are the signs or symptoms? Symptoms vary depending on the type of virus and the location of the cells that it invades. Common symptoms of the main types of viral illnesses  include: Cold and flu viruses   Fever.  Headache.  Sore throat.  Muscle aches.  Nasal congestion.  Cough. Digestive system (gastrointestinal) viruses   Fever.  Abdominal pain.  Nausea.  Diarrhea. Liver viruses (hepatitis)   Loss of appetite.  Tiredness.  Yellowing of the skin (jaundice). Brain and spinal cord viruses   Fever.  Headache.  Stiff neck.  Nausea and vomiting.  Confusion or sleepiness. Skin viruses   Warts.  Itching.  Rash. Sexually transmitted viruses   Discharge.  Swelling.  Redness.  Rash. How is this treated? Viruses can be difficult to treat because they live within cells. Antibiotic medicines do not treat viruses because these drugs do not get inside cells. Treatment for a viral illness may include:  Resting and drinking plenty of fluids.  Medicines to relieve symptoms. These can include over-the-counter medicine for pain and fever, medicines for cough or congestion, and medicines to relieve diarrhea.  Antiviral medicines. These drugs are available only for certain types of viruses. They may help reduce flu symptoms if taken early. There are also many antiviral medicines for hepatitis and HIV/AIDS. Some viral illnesses can be prevented with vaccinations. A common example is the flu shot. Follow these instructions at home: Medicines    Take over-the-counter and prescription medicines only as told by your health care provider.  If you were prescribed an antiviral medicine, take it as told by your health care provider. Do not stop taking the medicine even if you start to   feel better.  Be aware of when antibiotics are needed and when they are not needed. Antibiotics do not treat viruses. If your health care provider thinks that you may have a bacterial infection as well as a viral infection, you may get an antibiotic.  Do not ask for an antibiotic prescription if you have been diagnosed with a viral illness. That will not make  your illness go away faster.  Frequently taking antibiotics when they are not needed can lead to antibiotic resistance. When this develops, the medicine no longer works against the bacteria that it normally fights. General instructions   Drink enough fluids to keep your urine clear or pale yellow.  Rest as much as possible.  Return to your normal activities as told by your health care provider. Ask your health care provider what activities are safe for you.  Keep all follow-up visits as told by your health care provider. This is important. How is this prevented? Take these actions to reduce your risk of viral infection:  Eat a healthy diet and get enough rest.  Wash your hands often with soap and water. This is especially important when you are in public places. If soap and water are not available, use hand sanitizer.  Avoid close contact with friends and family who have a viral illness.  If you travel to areas where viral gastrointestinal infection is common, avoid drinking water or eating raw food.  Keep your immunizations up to date. Get a flu shot every year as told by your health care provider.  Do not share toothbrushes, nail clippers, razors, or needles with other people.  Always practice safe sex. Contact a health care provider if:  You have symptoms of a viral illness that do not go away.  Your symptoms come back after going away.  Your symptoms get worse. Get help right away if:  You have trouble breathing.  You have a severe headache or a stiff neck.  You have severe vomiting or abdominal pain. This information is not intended to replace advice given to you by your health care provider. Make sure you discuss any questions you have with your health care provider. Document Released: 08/30/2015 Document Revised: 10/02/2015 Document Reviewed: 08/30/2015 Elsevier Interactive Patient Education  2017 [employer]  

## 2016-06-24 NOTE — Progress Notes (Signed)
Pre visit review using our clinic review tool, if applicable. No additional management support is needed unless otherwise documented below in the visit note. 

## 2016-07-03 ENCOUNTER — Encounter: Payer: Self-pay | Admitting: Adult Health

## 2016-07-03 ENCOUNTER — Ambulatory Visit (INDEPENDENT_AMBULATORY_CARE_PROVIDER_SITE_OTHER): Payer: 59 | Admitting: Adult Health

## 2016-07-03 VITALS — BP 138/72 | Temp 97.8°F | Ht 69.0 in | Wt 191.0 lb

## 2016-07-03 DIAGNOSIS — R42 Dizziness and giddiness: Secondary | ICD-10-CM | POA: Diagnosis not present

## 2016-07-03 DIAGNOSIS — J069 Acute upper respiratory infection, unspecified: Secondary | ICD-10-CM

## 2016-07-03 MED ORDER — DOXYCYCLINE HYCLATE 100 MG PO CAPS: 100.0000 mg | ORAL_CAPSULE | Freq: Two times a day (BID) | ORAL | 0 refills | Status: DC

## 2016-07-03 MED ORDER — FLUTICASONE PROPIONATE 50 MCG/ACT NA SUSP: 2.0000 | Freq: Every day | NASAL | 6 refills | Status: AC

## 2016-07-03 NOTE — Progress Notes (Signed)
Subjective:    Patient ID: Jeffrey Wagner, male    DOB: 12-19-1955, 61 y.o.   MRN: [id number]  HPI  61 year old male who presents to the office with an acute complaint of URI like symptoms. He saw Dr. Elease Hashimoto on 06/24/2016 for low grade fever, nasal congestion and cough with mild sore throat.   He reports that he started becoming dizzy earlier this week. The dizziness feels as though the world is spinning. It is worse when changing positions or moving his head horizontally. He is also finding it hard to read. He continues to have a low grade fever, dry cough and sinus pressure.   He denies any N/V/D  Review of Systems See HPI   Past Medical History:  Diagnosis Date  . Arthritis   . Hypertension     Social History   Social History  . Marital status: Married    Spouse name: N/A  . Number of children: N/A  . Years of education: N/A   Occupational History  . Not on file.   Social History Main Topics  . Smoking status: Current Every Day Smoker    Years: 35.00    Types: Cigarettes  . Smokeless tobacco: Never Used     Comment: 10 cigarettes per day  . Alcohol use 3.6 oz/week    6 Cans of beer per week     Comment: q 2 weeks  . Drug use: No  . Sexual activity: Yes   Other Topics Concern  . Not on file   Social History Narrative  . No narrative on file    No past surgical history on file.  No family history on file.  No Known Allergies  Current Outpatient Prescriptions on File Prior to Visit  Medication Sig Dispense Refill  . amLODipine (NORVASC) 5 MG tablet Take 1 tablet (5 mg total) by mouth daily. 90 tablet 2  . aspirin 81 MG tablet Take 81 mg by mouth daily.      Marland Kitchen atenolol-chlorthalidone (TENORETIC) 50-25 MG tablet TAKE 1 TABLET BY MOUTH EVERY MORNING. TIME FOR YEARLY PHYSICAL 90 tablet 0  . atorvastatin (LIPITOR) 20 MG tablet TAKE 1 TABLET EVERY DAY 90 tablet 1  . cyclobenzaprine (FLEXERIL) 10 MG tablet Take 1 tablet (10 mg total) by mouth 3 (three) times  daily as needed for muscle spasms. 60 tablet 2  . nicotine (EQ NICOTINE) 21 mg/24hr patch Place 1 patch (21 mg total) onto the skin daily. 45 patch 0   No current facility-administered medications on file prior to visit.     BP 138/72   Temp 97.8 F (36.6 C) (Oral)   Ht 5\' 9"  (1.753 m)   Wt 191 lb (86.6 kg)   BMI 28.21 kg/m       Objective:   Physical Exam  Constitutional: He is oriented to person, place, and time. He appears well-developed and well-nourished. No distress.  HENT:  Head: Normocephalic and atraumatic.  Right Ear: Hearing, external ear and ear canal normal. Tympanic membrane is bulging. Tympanic membrane is not erythematous.  Left Ear: Hearing, external ear and ear canal normal. Tympanic membrane is bulging. Tympanic membrane is not erythematous.  Nose: Rhinorrhea present. No mucosal edema. Right sinus exhibits no maxillary sinus tenderness and no frontal sinus tenderness. Left sinus exhibits no maxillary sinus tenderness and no frontal sinus tenderness.  Mouth/Throat: Uvula is midline, oropharynx is clear and moist and mucous membranes are normal. No oropharyngeal exudate.  Eyes: Pupils are equal, round, and  reactive to light. Right eye exhibits nystagmus (mild horizontal ). Left eye exhibits nystagmus (mild horizontal ).  Cardiovascular: Normal rate, regular rhythm, normal heart sounds and intact distal pulses.  Exam reveals no gallop and no friction rub.   No murmur heard. Pulmonary/Chest: Effort normal and breath sounds normal. No respiratory distress. He has no wheezes. He has no rales. He exhibits no tenderness.  Neurological: He is alert and oriented to person, place, and time.  Skin: Skin is warm and dry. No rash noted. He is not diaphoretic. No erythema. No pallor.  Psychiatric: He has a normal mood and affect. His behavior is normal. Judgment and thought content normal.  Nursing note and vitals reviewed.     Assessment & Plan:  1. Upper respiratory tract  infection, unspecified type - Will treat due to time frame and symptoms.  - doxycycline (VIBRAMYCIN) 100 MG capsule; Take 1 capsule (100 mg total) by mouth 2 (two) times daily.  Dispense: 14 capsule; Refill: 0 - fluticasone (FLONASE) 50 MCG/ACT nasal spray; Place 2 sprays into both nostrils daily.  Dispense: 16 g; Refill: 6 - Stay hydrated and rest 2. Vertigo - Likely caused form URI.  - fluticasone (FLONASE) 50 MCG/ACT nasal spray; Place 2 sprays into both nostrils daily.  Dispense: 16 g; Refill: 6 - Can get dramamine OTC  Dorothyann Peng, NP

## 2016-08-11 ENCOUNTER — Other Ambulatory Visit: Payer: Self-pay | Admitting: Family Medicine

## 2016-08-11 DIAGNOSIS — I10 Essential (primary) hypertension: Secondary | ICD-10-CM

## 2016-08-12 ENCOUNTER — Telehealth: Payer: Self-pay | Admitting: Family Medicine

## 2016-08-12 DIAGNOSIS — I10 Essential (primary) hypertension: Secondary | ICD-10-CM

## 2016-08-12 NOTE — Telephone Encounter (Signed)
Pt need new Rx for atenolol-chlorthalidone  Pharm:  [facility]

## 2016-08-13 MED ORDER — ATENOLOL-CHLORTHALIDONE 50-25 MG PO TABS: ORAL_TABLET | ORAL | 0 refills | Status: DC

## 2016-08-13 NOTE — Telephone Encounter (Signed)
Rx sent 

## 2016-09-22 ENCOUNTER — Encounter: Payer: Self-pay | Admitting: Internal Medicine

## 2016-11-07 ENCOUNTER — Other Ambulatory Visit: Payer: Self-pay | Admitting: Family Medicine

## 2016-11-07 DIAGNOSIS — I10 Essential (primary) hypertension: Secondary | ICD-10-CM

## 2017-03-10 ENCOUNTER — Other Ambulatory Visit: Payer: Self-pay | Admitting: Family Medicine

## 2017-03-10 DIAGNOSIS — I10 Essential (primary) hypertension: Secondary | ICD-10-CM

## 2017-04-02 ENCOUNTER — Other Ambulatory Visit: Payer: Self-pay | Admitting: Occupational Medicine

## 2017-04-02 ENCOUNTER — Ambulatory Visit: Payer: Self-pay

## 2017-04-02 DIAGNOSIS — Z0000 Encounter for general adult medical examination without abnormal findings: Secondary | ICD-10-CM

## 2017-05-09 ENCOUNTER — Other Ambulatory Visit: Payer: Self-pay | Admitting: Family Medicine

## 2017-05-09 DIAGNOSIS — I10 Essential (primary) hypertension: Secondary | ICD-10-CM

## 2017-07-03 ENCOUNTER — Other Ambulatory Visit: Payer: Self-pay | Admitting: Family Medicine

## 2017-07-03 DIAGNOSIS — I10 Essential (primary) hypertension: Secondary | ICD-10-CM

## 2017-07-05 NOTE — Telephone Encounter (Addendum)
Tried calling patient, unable to leave message, mailbox not set up. 

## 2017-07-06 NOTE — Telephone Encounter (Signed)
Tried calling patient again, no answer, unable to leave message, mailbox isn't set up.

## 2017-07-06 NOTE — Telephone Encounter (Addendum)
Tried calling patient to schedule a follow-up for medication refill, unable to leave message, mailbox not set up.

## 2017-07-07 NOTE — Telephone Encounter (Addendum)
Tried calling patient to schedule a physical for medication refill, unable to get in touch with patient. Unable to leave message, voicemail not set-up.

## 2017-07-10 ENCOUNTER — Other Ambulatory Visit: Payer: Self-pay | Admitting: Family Medicine

## 2017-07-10 DIAGNOSIS — I10 Essential (primary) hypertension: Secondary | ICD-10-CM

## 2017-07-12 NOTE — Telephone Encounter (Signed)
Have been trying to call patient since last week to let him know that he needed to schedule a physical, unable to leave message, voicemail not set up.

## 2017-07-13 NOTE — Telephone Encounter (Signed)
Left message with wife to have patient call the office to schedule an appointment for physical so that medications can be refilled. Wife verbalized understanding.

## 2017-07-15 ENCOUNTER — Telehealth: Payer: Self-pay | Admitting: Family Medicine

## 2017-07-15 NOTE — Telephone Encounter (Signed)
Patient is needing a refill on his:  atenolol-chlorthalidone (TENORETIC) 50-25 MG tablet  atorvastatin (LIPITOR) 20 MG tablet  He stated that he can take a half day from work but won't be able to get off till 70.   Please advise

## 2017-07-16 NOTE — Telephone Encounter (Signed)
Tried calling both numbers in chart to schedule patient an appointment for medications refills. No answer and voicemail isn't set up on either phone. Patient can schedule appointment for whatever time is available and convenient   for him so that he can get medication refilled.

## 2017-07-20 NOTE — Telephone Encounter (Signed)
Tried calling patient several times on home and mobile phones to let him know that he could schedule appointment whenever it was convenient for him, no answer and unable to leave message, no voicemail set-up. At this time per Dr. Martinique, medications will not be refilled because patient need a physical, has not been seen since 2017.

## 2017-07-26 DIAGNOSIS — E785 Hyperlipidemia, unspecified: Secondary | ICD-10-CM | POA: Insufficient documentation

## 2017-07-26 NOTE — Progress Notes (Signed)
HPI:   [name] is a 62 y.o. male, who is here today for follow up on some of his chronic problems, he needs medications refill.   He was last seen on 12/11/2015 for his routine CPE.   Hypertension: He was on Tenoretic 50-25 mg daily and Amlodipine 5 mg daily until 2 weeks ago when he ran out of medication. Last eyes exam in 2017. BP's home "good" when he was taking medications.   No side effects reported.  He has not noted unusual headache, visual changes, exertional chest pain, dyspnea,  focal weakness, or edema.   Lab Results  Component Value Date   CREATININE 0.72 12/11/2015   BUN 13 12/11/2015   NA 138 12/11/2015   K 3.5 12/11/2015   CL 101 12/11/2015   CO2 29 12/11/2015   + Smoker.   Hyperlipidemia:  He was on atorvastatin 20 mg daily, ran out of medication 2 weeks ago. Following a low fat diet: Not consistently.  He has not noted side effects with medication.  Lab Results  Component Value Date   CHOL 231 (H) 12/11/2015   HDL 51.10 12/11/2015   LDLCALC 165 (H) 12/11/2015   LDLDIRECT 143.3 11/24/2010   TRIG 74.0 12/11/2015   CHOLHDL 5 12/11/2015    Right shoulder pain intermittently, started after falling on his right shoulder while working on his yard, this happened last summer. He is taking Aleve as needed for pain Pain is alleviated by rest and exacerbated by repetitive overhead activities. Pain is moderate, he has some limitation of movement. He follows with chiropractor for about 2 months after injury, according to patient, he had imaging done.  Hx of OA.  He is also requesting refill for Flexeril 10 mg. He takes medication as needed for lower back pain/spasms. Pain is not radiated, he denies lower extremity numbness or tingling. No associated saddle anesthesia or urine/bowel incontinence.   He is overdue for colonoscopy, last one in 08/2006.  Review of Systems  Constitutional: Negative for activity change, appetite change,  fatigue and fever.  HENT: Negative for nosebleeds, sore throat and trouble swallowing.   Eyes: Negative for redness and visual disturbance.  Respiratory: Negative for cough, shortness of breath and wheezing.   Cardiovascular: Negative for chest pain, palpitations and leg swelling.  Gastrointestinal: Negative for abdominal pain, nausea and vomiting.  Endocrine: Negative for cold intolerance and heat intolerance.  Genitourinary: Negative for decreased urine volume, dysuria and hematuria.  Musculoskeletal: Positive for arthralgias and back pain. Negative for gait problem.  Skin: Negative for rash.  Neurological: Negative for syncope, weakness, numbness and headaches.    Current Outpatient Medications on File Prior to Visit  Medication Sig Dispense Refill  . aspirin 81 MG tablet Take 81 mg by mouth daily.      . fluticasone (FLONASE) 50 MCG/ACT nasal spray Place 2 sprays into both nostrils daily. 16 g 6  . nicotine (EQ NICOTINE) 21 mg/24hr patch Place 1 patch (21 mg total) onto the skin daily. (Patient not taking: Reported on 07/27/2017) 45 patch 0   No current facility-administered medications on file prior to visit.      Past Medical History:  Diagnosis Date  . Arthritis   . Hypertension    No Known Allergies  Social History   Socioeconomic History  . Marital status: Married    Spouse name: Not on file  . Number of children: Not on file  . Years of education: Not on file  .  Highest education level: Not on file  Occupational History  . Not on file  Social Needs  . Financial resource strain: Not on file  . Food insecurity:    Worry: Not on file    Inability: Not on file  . Transportation needs:    Medical: Not on file    Non-medical: Not on file  Tobacco Use  . Smoking status: Current Every Day Smoker    Years: 35.00    Types: Cigarettes  . Smokeless tobacco: Never Used  . Tobacco comment: 10 cigarettes per day  Substance and Sexual Activity  . Alcohol use: Yes     Alcohol/week: 3.6 oz    Types: 6 Cans of beer per week    Comment: q 2 weeks  . Drug use: No  . Sexual activity: Yes  Lifestyle  . Physical activity:    Days per week: Not on file    Minutes per session: Not on file  . Stress: Not on file  Relationships  . Social connections:    Talks on phone: Not on file    Gets together: Not on file    Attends religious service: Not on file    Active member of club or organization: Not on file    Attends meetings of clubs or organizations: Not on file    Relationship status: Not on file  Other Topics Concern  . Not on file  Social History Narrative  . Not on file    Vitals:   07/27/17 1537  BP: (!) 150/90  Pulse: 77  Resp: 12  Temp: 98.4 F (36.9 C)  SpO2: 98%   Body mass index is 27.82 kg/m.    Physical Exam  Nursing note and vitals reviewed. Constitutional: He is oriented to person, place, and time. He appears well-developed and well-nourished. No distress.  HENT:  Head: Normocephalic and atraumatic.  Mouth/Throat: Oropharynx is clear and moist and mucous membranes are normal.  Eyes: Pupils are equal, round, and reactive to light. Conjunctivae are normal.  Neck: No JVD present.  Cardiovascular: Normal rate and regular rhythm.  No murmur heard. Pulses:      Dorsalis pedis pulses are 2+ on the right side, and 2+ on the left side.  Respiratory: Effort normal and breath sounds normal. No respiratory distress.  GI: Soft. He exhibits no mass. There is no hepatomegaly. There is no tenderness.  Musculoskeletal: He exhibits no edema.       Lumbar back: He exhibits no tenderness and no bony tenderness.  Right shoulder: No deformity appreciated. Luan Pulling' test pos, empty can supraspinatus test pos, cross body adduction test pos, lift-Off Subscapularis test neg. Passive external rotation causes pain. Active and passive ROM mildly limited.  Lymphadenopathy:    He has no cervical adenopathy.  Neurological: He is alert and oriented to  person, place, and time. He has normal strength.  SLR negative bilateral.  Skin: Skin is warm. No rash noted. No erythema.  Psychiatric: He has a normal mood and affect.  Well groomed, good eye contact.     ASSESSMENT AND PLAN:   [name] was seen today for follow-up.  Orders Placed This Encounter  Procedures  . Lipid panel  . Comprehensive metabolic panel  . Ambulatory referral to Gastroenterology    Right shoulder pain We discussed possible etiologies,?  Rotator cuff tendinitis associated with OA. We discussed some side effects of oral NSAIDs, recommend topical Voltaren instead.   Tylenol arthritis 650 mg 3 times per day may  also help.  PT exercises given on handout. Will consider orthopedic referral. Also recommend trying to avoid activities that exacerbate pain. Follow-up as needed.  Essential hypertension Not well controlled. Possible complications of elevated BP discussed. He will resume his antihypertensive medications. Annual eye examination. BP check in 6 weeks and f/u in 5 months.   Hyperlipidemia No changes in Atorvastatin 20 mg daily. Low fat diet also recommended. He will come back next week for fasting labs. Follow-up in 6-12 months, depending of lipid panel results.  LOW BACK PAIN Stable. Some side effects of Flexeril discussed, recommend taking it at bedtime as needed. Tylenol arthritis may also help.   Colon cancer screening - Ambulatory referral to Gastroenterology       -[name] was advised to return sooner than planned today if new concerns arise.       Nehemiah Montee G. Martinique, MD  [facility]. [place] office.

## 2017-07-27 ENCOUNTER — Ambulatory Visit (INDEPENDENT_AMBULATORY_CARE_PROVIDER_SITE_OTHER): Payer: 59 | Admitting: Family Medicine

## 2017-07-27 ENCOUNTER — Encounter: Payer: Self-pay | Admitting: Family Medicine

## 2017-07-27 VITALS — BP 150/90 | HR 77 | Temp 98.4°F | Resp 12 | Ht 69.0 in | Wt 188.4 lb

## 2017-07-27 DIAGNOSIS — M25511 Pain in right shoulder: Secondary | ICD-10-CM

## 2017-07-27 DIAGNOSIS — E785 Hyperlipidemia, unspecified: Secondary | ICD-10-CM

## 2017-07-27 DIAGNOSIS — Z1211 Encounter for screening for malignant neoplasm of colon: Secondary | ICD-10-CM | POA: Diagnosis not present

## 2017-07-27 DIAGNOSIS — G8929 Other chronic pain: Secondary | ICD-10-CM | POA: Diagnosis not present

## 2017-07-27 DIAGNOSIS — M545 Low back pain: Secondary | ICD-10-CM

## 2017-07-27 DIAGNOSIS — I10 Essential (primary) hypertension: Secondary | ICD-10-CM | POA: Diagnosis not present

## 2017-07-27 MED ORDER — AMLODIPINE BESYLATE 5 MG PO TABS: 5.0000 mg | ORAL_TABLET | Freq: Every day | ORAL | 1 refills | Status: DC

## 2017-07-27 MED ORDER — DICLOFENAC SODIUM 1 % TD GEL
4.0000 g | Freq: Four times a day (QID) | TRANSDERMAL | 3 refills | Status: DC
Start: 2017-07-27 — End: 2019-11-15

## 2017-07-27 MED ORDER — ATORVASTATIN CALCIUM 20 MG PO TABS: 20.0000 mg | ORAL_TABLET | Freq: Every day | ORAL | 1 refills | Status: DC

## 2017-07-27 MED ORDER — CYCLOBENZAPRINE HCL 10 MG PO TABS: 10.0000 mg | ORAL_TABLET | Freq: Every evening | ORAL | 2 refills | Status: DC | PRN

## 2017-07-27 MED ORDER — ATENOLOL-CHLORTHALIDONE 50-25 MG PO TABS: 1.0000 | ORAL_TABLET | Freq: Every morning | ORAL | 1 refills | Status: DC

## 2017-07-27 NOTE — Assessment & Plan Note (Signed)
Not well controlled. Possible complications of elevated BP discussed. He will resume his antihypertensive medications. Annual eye examination. BP check in 6 weeks and f/u in 5 months.

## 2017-07-27 NOTE — Assessment & Plan Note (Signed)
Stable. Some side effects of Flexeril discussed, recommend taking it at bedtime as needed. Tylenol arthritis may also help.

## 2017-07-27 NOTE — Assessment & Plan Note (Signed)
No changes in Atorvastatin 20 mg daily. Low fat diet also recommended. He will come back next week for fasting labs. Follow-up in 6-12 months, depending of lipid panel results.

## 2017-07-27 NOTE — Patient Instructions (Signed)
A few things to remember from today's visit:   Essential hypertension - Plan: Comprehensive metabolic panel, atenolol-chlorthalidone (TENORETIC) 50-25 MG tablet, amLODipine (NORVASC) 5 MG tablet  Hyperlipidemia, unspecified hyperlipidemia type - Plan: Lipid panel, Comprehensive metabolic panel  Colon cancer screening - Plan: Ambulatory referral to Gastroenterology  Chronic right shoulder pain - Plan: diclofenac sodium (VOLTAREN) 1 % GEL  You need eye exam.  Labs Monday,fasting.   Shoulder Exercises Ask your health care provider which exercises are safe for you. Do exercises exactly as told by your health care provider and adjust them as directed. It is normal to feel mild stretching, pulling, tightness, or discomfort as you do these exercises, but you should stop right away if you feel sudden pain or your pain gets worse.Do not begin these exercises until told by your health care provider. RANGE OF MOTION EXERCISES These exercises warm up your muscles and joints and improve the movement and flexibility of your shoulder. These exercises also help to relieve pain, numbness, and tingling. These exercises involve stretching your injured shoulder directly. Exercise A: Pendulum  1. Stand near a wall or a surface that you can hold onto for balance. 2. Bend at the waist and let your left / right arm hang straight down. Use your other arm to support you. Keep your back straight and do not lock your knees. 3. Relax your left / right arm and shoulder muscles, and move your hips and your trunk so your left / right arm swings freely. Your arm should swing because of the motion of your body, not because you are using your arm or shoulder muscles. 4. Keep moving your body so your arm swings in the following directions, as told by your health care provider: ? Side to side. ? Forward and backward. ? In clockwise and counterclockwise circles. 5. Continue each motion for _____30_____ seconds, or for as  long as told by your health care provider. 6. Slowly return to the starting position. Repeat _______2___ times. Complete this exercise ____2______ times a day. Exercise B:Flexion, Standing  1. Stand and hold a broomstick, a cane, or a similar object. Place your hands a little more than shoulder-width apart on the object. Your left / right hand should be palm-up, and your other hand should be palm-down. 2. Keep your elbow straight and keep your shoulder muscles relaxed. Push the stick down with your healthy arm to raise your left / right arm in front of your body, and then over your head until you feel a stretch in your shoulder. ? Avoid shrugging your shoulder while you raise your arm. Keep your shoulder blade tucked down toward the middle of your back. 3. Hold for ______15____ seconds. 4. Slowly return to the starting position. Repeat __________ times. Complete this exercise 2__________ times a day. Exercise C: Abd2uction, Standing 1. Stand and hold a broomstick, a cane, or a similar object. Place your hands a little more than shoulder-width apart on the object. Your left / right hand should be palm-up, and your other hand should be palm-down. 2. While keeping your elbow straight and your shoulder muscles relaxed, push the stick across your body toward your left / right side. Raise your left / right arm to the side of your body and then over your head until you feel a stretch in your shoulder. ? Do not raise your arm above shoulder height, unless your health care provider tells you to do that. ? Avoid shrugging your shoulder while you raise your arm.  Keep your shoulder blade tucked down toward the middle of your back. 3. Hold for __15________ seconds. 4. Slowly return to the starting position. Repeat ______2____ times. Complete this exercise ____2______ times a day. Exercise D:Internal Rotation  1. Place your left / right hand behind your back, palm-up. 2. Use your other hand to dangle an  exercise band, a towel, or a similar object over your shoulder. Grasp the band with your left / right hand so you are holding onto both ends. 3. Gently pull up on the band until you feel a stretch in the front of your left / right shoulder. ? Avoid shrugging your shoulder while you raise your arm. Keep your shoulder blade tucked down toward the middle of your back. 4. Hold for __________ seconds. 5. Release the stretch by letting go of the band and lowering your hands. Repeat __________ times. Complete this exercise __________ times a day. STRETCHING EXERCISES These exercises warm up your muscles and joints and improve the movement and flexibility of your shoulder. These exercises also help to relieve pain, numbness, and tingling. These exercises are done using your healthy shoulder to help stretch the muscles of your injured shoulder. Exercise E: Warehouse manager (External Rotation and Abduction)  1. Stand in a doorway with one of your feet slightly in front of the other. This is called a staggered stance. If you cannot reach your forearms to the door frame, stand facing a corner of a room. 2. Choose one of the following positions as told by your health care provider: ? Place your hands and forearms on the door frame above your head. ? Place your hands and forearms on the door frame at the height of your head. ? Place your hands on the door frame at the height of your elbows. 3. Slowly move your weight onto your front foot until you feel a stretch across your chest and in the front of your shoulders. Keep your head and chest upright and keep your abdominal muscles tight. 4. Hold for _____15_____ seconds. 5. To release the stretch, shift your weight to your back foot. Repeat _______2___ times. Complete this stretch ___2_______ times a day. Exercise F:Extension, Standing 1. Stand and hold a broomstick, a cane, or a similar object behind your back. ? Your hands should be a little wider than  shoulder-width apart. ? Your palms should face away from your back. 2. Keeping your elbows straight and keeping your shoulder muscles relaxed, move the stick away from your body until you feel a stretch in your shoulder. ? Avoid shrugging your shoulders while you move the stick. Keep your shoulder blade tucked down toward the middle of your back. 3. Hold for ___15_______ seconds. 4. Slowly return to the starting position. Repeat ________2__ times. Complete this exercise __2________ times a day. STRENGTHENING EXERCISES These exercises build strength and endurance in your shoulder. Endurance is the ability to use your muscles for a long time, even after they get tired. Exercise G:External Rotation  1. Sit in a stable chair without armrests. 2. Secure an exercise band at elbow height on your left / right side. 3. Place a soft object, such as a folded towel or a small pillow, between your left / right upper arm and your body to move your elbow a few inches away (about 10 cm) from your side. 4. Hold the end of the band so it is tight and there is no slack. 5. Keeping your elbow pressed against the soft object, move your left /  right forearm out, away from your abdomen. Keep your body steady so only your forearm moves.  Exercise I:Shoulder Extension 1. Sit in a stable chair without armrests, or stand. 2. Secure an exercise band to a stable object in front of you where it is at shoulder height. 3. Hold one end of the exercise band in each hand. Your palms should face each other. 4. Straighten your elbows and lift your hands up to shoulder height. 5. Step back, away from the secured end of the exercise band, until the band is tight and there is no slack. 6. Squeeze your shoulder blades together as you pull your hands down to the sides of your thighs. Stop when your hands are straight down by your sides. Do not let your hands go behind your body.  Exercise J:Standing Shoulder Row 1. Sit in a  stable chair without armrests, or stand. 2. Secure an exercise band to a stable object in front of you so it is at waist height. 3. Hold one end of the exercise band in each hand. Your palms should be in a thumbs-up position. 4. Bend each of your elbows to an "L" shape (about 90 degrees) and keep your upper arms at your sides. 5. Step back until the band is tight and there is no slack. 6. Slowly pull your elbows back behind you. Marland Kitchen Exercise K:Shoulder Press-Ups  1. Sit in a stable chair that has armrests. Sit upright, with your feet flat on the floor. 2. Put your hands on the armrests so your elbows are bent and your fingers are pointing forward. Your hands should be about even with the sides of your body. 3. Push down on the armrests and use your arms to lift yourself off of the chair. Straighten your elbows and lift yourself up as much as you comfortably can. ? Move your shoulder blades down, and avoid letting your shoulders move up toward your ears. ? Keep your feet on the ground. As you get stronger, your feet should support less of your body weight as you lift yourself up. 4. Hold for __________ seconds. 5. Slowly lower yourself back into the chair. Repeat __________ times. Complete this exercise __________ times a day. Exercise L: Wall Push-Ups  1. Stand so you are facing a stable wall. Your feet should be about one arm-length away from the wall. 2. Lean forward and place your palms on the wall at shoulder height. 3. Keep your feet flat on the floor as you bend your elbows and lean forward toward the wall. 4. Hold for __________ seconds. 5. Straighten your elbows to push yourself back to the starting position. Repeat __________ times. Complete this exercise __________ times a day. This information is not intended to replace advice given to you by your health care provider. Make sure you discuss any questions you have with your health care provider. Document Released: 03/04/2005  Document Revised: 01/13/2016 Document Reviewed: 12/30/2014 Elsevier Interactive Patient Education  2018 [employer].  Please be sure medication list is accurate. If a new problem present, please set up appointment sooner than planned today.

## 2017-07-27 NOTE — Assessment & Plan Note (Signed)
We discussed possible etiologies,?  Rotator cuff tendinitis associated with OA. We discussed some side effects of oral NSAIDs, recommend topical Voltaren instead.   Tylenol arthritis 650 mg 3 times per day may also help.  PT exercises given on handout. Will consider orthopedic referral. Also recommend trying to avoid activities that exacerbate pain. Follow-up as needed.

## 2017-08-02 ENCOUNTER — Ambulatory Visit: Payer: Self-pay | Admitting: Family Medicine

## 2017-08-02 ENCOUNTER — Ambulatory Visit: Payer: 59

## 2017-08-02 ENCOUNTER — Other Ambulatory Visit (INDEPENDENT_AMBULATORY_CARE_PROVIDER_SITE_OTHER): Payer: 59

## 2017-08-02 VITALS — BP 146/90

## 2017-08-02 DIAGNOSIS — E785 Hyperlipidemia, unspecified: Secondary | ICD-10-CM | POA: Diagnosis not present

## 2017-08-02 DIAGNOSIS — I10 Essential (primary) hypertension: Secondary | ICD-10-CM

## 2017-08-02 NOTE — Progress Notes (Signed)
Patient had a nurse visit appointment today for Blood pressure check (146/90 ), BP was taken on pt right arm twice, see pt chart for the readings. Taken by Rosealee Albee CMA.

## 2017-08-03 LAB — LIPID PANEL
Cholesterol: 192 mg/dL (ref 0–200)
HDL: 56.7 mg/dL (ref >39.00)
LDL Cholesterol: 119 mg/dL — ABNORMAL HIGH (ref 0–99)
NONHDL: 135.68
Total CHOL/HDL Ratio: 3
Triglycerides: 81 mg/dL (ref 0.0–149.0)
VLDL: 16.2 mg/dL (ref 0.0–40.0)

## 2017-08-03 LAB — COMPREHENSIVE METABOLIC PANEL
ALT: 13 U/L (ref 0–53)
AST: 16 U/L (ref 0–37)
Albumin: 4.1 g/dL (ref 3.5–5.2)
Alkaline Phosphatase: 66 U/L (ref 39–117)
BILIRUBIN TOTAL: 1.4 mg/dL — AB (ref 0.2–1.2)
BUN: 13 mg/dL (ref 6–23)
CO2: 29 mEq/L (ref 19–32)
Calcium: 9.5 mg/dL (ref 8.4–10.5)
Chloride: 101 mEq/L (ref 96–112)
Creatinine, Ser: 0.75 mg/dL (ref 0.40–1.50)
GFR: 136.78 mL/min (ref >60.00)
GLUCOSE: 113 mg/dL — AB (ref 70–99)
Potassium: 3.5 mEq/L (ref 3.5–5.1)
SODIUM: 138 meq/L (ref 135–145)
TOTAL PROTEIN: 7.3 g/dL (ref 6.0–8.3)

## 2017-08-16 NOTE — Progress Notes (Signed)
Per Dr. Martinique: BP still mildly elevated.  Increase Amlodipine from 5 mg to 10 mg.  F/U in 3 months.  Continue monitoring BP at home.   Thanks,  BJ

## 2017-08-16 NOTE — Progress Notes (Signed)
Called patient at both numbers listed. Unable to leave message due to no voicemail set up on either number.

## 2017-08-17 ENCOUNTER — Encounter: Payer: Self-pay | Admitting: *Deleted

## 2017-08-30 ENCOUNTER — Encounter: Payer: Self-pay | Admitting: Family Medicine

## 2017-10-18 ENCOUNTER — Encounter: Payer: Self-pay | Admitting: Family Medicine

## 2017-10-18 ENCOUNTER — Encounter: Payer: Self-pay | Admitting: *Deleted

## 2017-10-18 ENCOUNTER — Ambulatory Visit (INDEPENDENT_AMBULATORY_CARE_PROVIDER_SITE_OTHER): Payer: 59 | Admitting: Family Medicine

## 2017-10-18 VITALS — BP 140/82 | HR 75 | Temp 98.4°F | Resp 12 | Ht 69.0 in | Wt 181.5 lb

## 2017-10-18 DIAGNOSIS — R61 Generalized hyperhidrosis: Secondary | ICD-10-CM | POA: Diagnosis not present

## 2017-10-18 DIAGNOSIS — M549 Dorsalgia, unspecified: Secondary | ICD-10-CM | POA: Diagnosis not present

## 2017-10-18 DIAGNOSIS — G8929 Other chronic pain: Secondary | ICD-10-CM

## 2017-10-18 DIAGNOSIS — M25511 Pain in right shoulder: Secondary | ICD-10-CM

## 2017-10-18 NOTE — Progress Notes (Signed)
ACUTE VISIT   HPI:  Chief Complaint  Patient presents with  . Right shoulder pain    started 2 weeks ago  . Back Pain    upper back near right shoulder blade    Jeffrey Wagner is a 62 y.o. male, who is here today complaining of 2 weeks of right upper back pain and shoulder pain. He has history of OA, right shoulder pain, and lower back pain. She is reporting upper back pain as a new problem.  No history of trauma or unusual physical activity. Pain is constant, "nagging pain", moderate, and nonradiating. Pain is exacerbated by movement and alleviated by applying constant pressure to the area. He has not noted local erythema or edema.  Right shoulder pain has been intermittently since summer 2018 after he had a fall. He has seen chiropractor in the past. Pain is exacerbated by repetitive movement mainly with overhead activities.  When inquired about fever, he states that he has had a fever at night for 2 to 3 weeks.  He has not taken temperature but describes night sweats. He has not identified exacerbating or alleviating factors. He is a smoker. No abnormal weight loss, cough, wheezing, or chest pain.  He is on Flexeril 10 mg at bedtime as needed and Voltaren gel, the latter one was prescribed for right shoulder pain.    Lab Results  Component Value Date   TSH 0.72 10/23/2013    Review of Systems  Constitutional: Negative for appetite change, chills and fatigue.  HENT: Negative for mouth sores and sore throat.   Respiratory: Negative for cough, shortness of breath and wheezing.   Gastrointestinal: Negative for abdominal pain, nausea and vomiting.  Genitourinary: Negative for decreased urine volume, hematuria and urgency.  Musculoskeletal: Positive for arthralgias and back pain. Negative for gait problem.  Skin: Negative for color change and rash.  Neurological: Negative for syncope, weakness, numbness and headaches.  Psychiatric/Behavioral: Negative for  confusion. The patient is nervous/anxious.       Current Outpatient Medications on File Prior to Visit  Medication Sig Dispense Refill  . amLODipine (NORVASC) 5 MG tablet Take 1 tablet (5 mg total) by mouth daily. 90 tablet 1  . aspirin 81 MG tablet Take 81 mg by mouth daily.      Marland Kitchen atenolol-chlorthalidone (TENORETIC) 50-25 MG tablet Take 1 tablet by mouth every morning. 90 tablet 1  . atorvastatin (LIPITOR) 20 MG tablet Take 1 tablet (20 mg total) by mouth daily. 90 tablet 1  . cyclobenzaprine (FLEXERIL) 10 MG tablet Take 1 tablet (10 mg total) by mouth at bedtime as needed for muscle spasms. 30 tablet 2  . diclofenac sodium (VOLTAREN) 1 % GEL Apply 4 g topically 4 (four) times daily. 4 Tube 3  . fluticasone (FLONASE) 50 MCG/ACT nasal spray Place 2 sprays into both nostrils daily. 16 g 6  . nicotine (EQ NICOTINE) 21 mg/24hr patch Place 1 patch (21 mg total) onto the skin daily. 45 patch 0   No current facility-administered medications on file prior to visit.      Past Medical History:  Diagnosis Date  . Arthritis   . Hypertension    No Known Allergies  Social History   Socioeconomic History  . Marital status: Married    Spouse name: Not on file  . Number of children: Not on file  . Years of education: Not on file  . Highest education level: Not on file  Occupational History  . Not  on file  Social Needs  . Financial resource strain: Not on file  . Food insecurity:    Worry: Not on file    Inability: Not on file  . Transportation needs:    Medical: Not on file    Non-medical: Not on file  Tobacco Use  . Smoking status: Current Every Day Smoker    Years: 35.00    Types: Cigarettes  . Smokeless tobacco: Never Used  . Tobacco comment: 10 cigarettes per day  Substance and Sexual Activity  . Alcohol use: Yes    Alcohol/week: 3.6 oz    Types: 6 Cans of beer per week    Comment: q 2 weeks  . Drug use: No  . Sexual activity: Yes  Lifestyle  . Physical activity:     Days per week: Not on file    Minutes per session: Not on file  . Stress: Not on file  Relationships  . Social connections:    Talks on phone: Not on file    Gets together: Not on file    Attends religious service: Not on file    Active member of club or organization: Not on file    Attends meetings of clubs or organizations: Not on file    Relationship status: Not on file  Other Topics Concern  . Not on file  Social History Narrative  . Not on file    Vitals:   10/18/17 1528  BP: 140/82  Pulse: 75  Resp: 12  Temp: 98.4 F (36.9 C)  SpO2: 99%   Body mass index is 26.8 kg/m.   Physical Exam  Nursing note and vitals reviewed. Constitutional: He is oriented to person, place, and time. He appears well-developed and well-nourished. No distress.  HENT:  Head: Normocephalic and atraumatic.  Eyes: Conjunctivae are normal.  Cardiovascular: Normal rate and regular rhythm.  Respiratory: Effort normal and breath sounds normal. No respiratory distress.  Musculoskeletal: He exhibits tenderness. He exhibits no edema.       Back:  Pain upon palpation of right interscapular area. There is no rash, erythema, or local edema.  Right shoulder:  Luan Pulling' test neg, empty can supraspinatus test neg, lift-Off Subscapularis test neg. ROM mildly limited. Pain on upper back elicited with movement.  Lymphadenopathy:    He has no cervical adenopathy.       Right: No supraclavicular adenopathy present.       Left: No supraclavicular adenopathy present.  Neurological: He is alert and oriented to person, place, and time. He has normal strength.  Skin: No rash noted. No erythema.  Psychiatric: His mood appears anxious.  Well-groomed, good eye contact.      ASSESSMENT AND PLAN:   Jeffrey Wagner was seen today for right shoulder pain and back pain.  Diagnoses and all orders for this visit:  Upper back pain on right side  Because he is reporting this is a new problem+?  Fever, imaging was  ordered. Local massage and ice may help. He agrees with PT referral. Excuse note given for work.  -     DG Thoracic Spine W/Swimmers; Future -     CBC with Differential/Platelet -     Ambulatory referral to Physical Therapy  Night sweats  ?  Fever. We discussed adverse effects of tobacco use. Further recommendation will be given according to CBC and TSH results.  -     CBC with Differential/Platelet -     TSH  Chronic right shoulder pain  This problem  is chronic. Recommend continuing Voltaren gel as well as range of motion exercises.  Follow-up as needed.      Return if symptoms worsen or fail to improve.     Thor Nannini G. Martinique, MD  [facility]. [place] office.

## 2017-10-18 NOTE — Patient Instructions (Addendum)
A few things to remember from today's visit:   Upper back pain on right side - Plan: DG Thoracic Spine W/Swimmers, CBC with Differential/Platelet, Ambulatory referral to Physical Therapy  Night sweats - Plan: CBC with Differential/Platelet, TSH  Local IcyHot with lidocaine. Ice. Local massage. We will be arranging physical therapy.  Today X Toivo was ordered.  This can be done at [facility] between 8 am and 5 pm: [place]. [phone].   Please be sure medication list is accurate. If a new problem present, please set up appointment sooner than planned today.

## 2017-10-19 ENCOUNTER — Ambulatory Visit (INDEPENDENT_AMBULATORY_CARE_PROVIDER_SITE_OTHER)
Admission: RE | Admit: 2017-10-19 | Discharge: 2017-10-19 | Disposition: A | Discharge status: Home / Self Care | Payer: 59 | Source: Ambulatory Visit | Attending: Family Medicine | Admitting: Family Medicine

## 2017-10-19 DIAGNOSIS — M549 Dorsalgia, unspecified: Secondary | ICD-10-CM | POA: Diagnosis not present

## 2017-10-19 LAB — CBC WITH DIFFERENTIAL/PLATELET
BASOS ABS: 0.1 10*3/uL (ref 0.0–0.1)
Basophils Relative: 1.1 % (ref 0.0–3.0)
EOS ABS: 0.1 10*3/uL (ref 0.0–0.7)
Eosinophils Relative: 1.8 % (ref 0.0–5.0)
HCT: 38.5 % — ABNORMAL LOW (ref 39.0–52.0)
Hemoglobin: 13.1 g/dL (ref 13.0–17.0)
Lymphocytes Relative: 35.9 % (ref 12.0–46.0)
Lymphs Abs: 2.1 10*3/uL (ref 0.7–4.0)
MCHC: 34 g/dL (ref 30.0–36.0)
MCV: 101.5 fl — ABNORMAL HIGH (ref 78.0–100.0)
MONO ABS: 0.5 10*3/uL (ref 0.1–1.0)
Monocytes Relative: 8.4 % (ref 3.0–12.0)
Neutro Abs: 3.1 10*3/uL (ref 1.4–7.7)
Neutrophils Relative %: 52.8 % (ref 43.0–77.0)
Platelets: 255 10*3/uL (ref 150.0–400.0)
RBC: 3.79 Mil/uL — ABNORMAL LOW (ref 4.22–5.81)
RDW: 11.8 % (ref 11.5–15.5)
WBC: 5.8 10*3/uL (ref 4.0–10.5)

## 2017-10-19 LAB — TSH: TSH: 1.54 u[IU]/mL (ref 0.35–4.50)

## 2018-03-05 ENCOUNTER — Other Ambulatory Visit: Payer: Self-pay | Admitting: Family Medicine

## 2018-03-05 DIAGNOSIS — I10 Essential (primary) hypertension: Secondary | ICD-10-CM

## 2018-03-14 ENCOUNTER — Other Ambulatory Visit: Payer: Self-pay | Admitting: Family Medicine

## 2018-03-14 DIAGNOSIS — I10 Essential (primary) hypertension: Secondary | ICD-10-CM

## 2018-03-14 MED ORDER — ATENOLOL-CHLORTHALIDONE 50-25 MG PO TABS: ORAL_TABLET | ORAL | 0 refills | Status: DC

## 2018-04-15 ENCOUNTER — Encounter: Payer: Self-pay | Admitting: Family Medicine

## 2018-04-15 ENCOUNTER — Ambulatory Visit (INDEPENDENT_AMBULATORY_CARE_PROVIDER_SITE_OTHER): Payer: 59 | Admitting: Family Medicine

## 2018-04-15 VITALS — BP 140/90 | HR 64 | Temp 98.5°F | Resp 12 | Ht 69.0 in | Wt 186.4 lb

## 2018-04-15 DIAGNOSIS — I10 Essential (primary) hypertension: Secondary | ICD-10-CM

## 2018-04-15 DIAGNOSIS — Z23 Encounter for immunization: Secondary | ICD-10-CM | POA: Diagnosis not present

## 2018-04-15 DIAGNOSIS — M25562 Pain in left knee: Secondary | ICD-10-CM

## 2018-04-15 DIAGNOSIS — R739 Hyperglycemia, unspecified: Secondary | ICD-10-CM

## 2018-04-15 LAB — BASIC METABOLIC PANEL
BUN: 12 mg/dL (ref 6–23)
CALCIUM: 9.1 mg/dL (ref 8.4–10.5)
CO2: 29 mEq/L (ref 19–32)
Chloride: 103 mEq/L (ref 96–112)
Creatinine, Ser: 0.68 mg/dL (ref 0.40–1.50)
GFR: 152.8 mL/min (ref >60.00)
Glucose, Bld: 132 mg/dL — ABNORMAL HIGH (ref 70–99)
POTASSIUM: 3.8 meq/L (ref 3.5–5.1)
SODIUM: 141 meq/L (ref 135–145)

## 2018-04-15 LAB — HEMOGLOBIN A1C: HEMOGLOBIN A1C: 5.8 % (ref 4.6–6.5)

## 2018-04-15 NOTE — Assessment & Plan Note (Signed)
Slightly elevated BP. No changes in current management. Some side effects of NSAIDs discussed. Continue low-salt diet. Follow-up in 6 months, before if needed.

## 2018-04-15 NOTE — Progress Notes (Signed)
ACUTE VISIT   HPI:  Chief Complaint  Patient presents with  . Left knee pain    started 1 week ago    Jeffrey Wagner is a 62 y.o. male, who is here today complaining of left knee pain as described above.  He reports this as a new problem. No Hx of trauma or unusual activity. Achy like pain, "bad" in the morning when first gets up and alleviated by walking a few steps.  + Stiffness. Now pain is 6-7/10. He has  Not noted edema or erythema.  He has taken Aleve for pain.  Gradually improving.  Hypertension:  Last follow-up visit 07/27/2017. Currently on atenolol-chlorthalidone 50-25 mg daily and amlodipine 5 mg daily. He is taking medications as instructed, no side effects reported.  He has not noted unusual headache, visual changes, exertional chest pain, dyspnea,  focal weakness, or edema.   Lab Results  Component Value Date   CREATININE 0.75 08/02/2017   BUN 13 08/02/2017   NA 138 08/02/2017   K 3.5 08/02/2017   CL 101 08/02/2017   CO2 29 08/02/2017   IFG: Intermittent hyperglycemia, glucose 113. Negative for history of diabetes. Denies abdominal pain, nausea,vomiting, polydipsia,polyuria, or polyphagia.   Lab Results  Component Value Date   HGBA1C 5.5 10/08/2015   Still smoking.   Review of Systems  Constitutional: Negative for chills, fatigue and fever.  HENT: Negative for mouth sores, nosebleeds, sinus pressure and trouble swallowing.   Respiratory: Negative for shortness of breath and wheezing.   Cardiovascular: Negative for chest pain, palpitations and leg swelling.  Gastrointestinal: Negative for abdominal pain, nausea and vomiting.  Endocrine: Negative for polydipsia, polyphagia and polyuria.  Genitourinary: Negative for decreased urine volume and hematuria.  Musculoskeletal: Positive for arthralgias. Negative for joint swelling.  Skin: Negative for rash and wound.  Neurological: Negative for syncope, weakness, numbness and headaches.       Current Outpatient Medications on File Prior to Visit  Medication Sig Dispense Refill  . aspirin 81 MG tablet Take 81 mg by mouth daily.      Marland Kitchen atorvastatin (LIPITOR) 20 MG tablet Take 1 tablet (20 mg total) by mouth daily. 90 tablet 1  . cyclobenzaprine (FLEXERIL) 10 MG tablet Take 1 tablet (10 mg total) by mouth at bedtime as needed for muscle spasms. 30 tablet 2  . diclofenac sodium (VOLTAREN) 1 % GEL Apply 4 g topically 4 (four) times daily. 4 Tube 3  . fluticasone (FLONASE) 50 MCG/ACT nasal spray Place 2 sprays into both nostrils daily. 16 g 6   No current facility-administered medications on file prior to visit.      Past Medical History:  Diagnosis Date  . Arthritis   . Hypertension    No Known Allergies  Social History   Socioeconomic History  . Marital status: Married    Spouse name: Not on file  . Number of children: Not on file  . Years of education: Not on file  . Highest education level: Not on file  Occupational History  . Not on file  Social Needs  . Financial resource strain: Not on file  . Food insecurity:    Worry: Not on file    Inability: Not on file  . Transportation needs:    Medical: Not on file    Non-medical: Not on file  Tobacco Use  . Smoking status: Current Every Day Smoker    Years: 35.00    Types: Cigarettes  . Smokeless tobacco:  Never Used  . Tobacco comment: 10 cigarettes per day  Substance and Sexual Activity  . Alcohol use: Yes    Alcohol/week: 6.0 standard drinks    Types: 6 Cans of beer per week    Comment: q 2 weeks  . Drug use: No  . Sexual activity: Yes  Lifestyle  . Physical activity:    Days per week: Not on file    Minutes per session: Not on file  . Stress: Not on file  Relationships  . Social connections:    Talks on phone: Not on file    Gets together: Not on file    Attends religious service: Not on file    Active member of club or organization: Not on file    Attends meetings of clubs or  organizations: Not on file    Relationship status: Not on file  Other Topics Concern  . Not on file  Social History Narrative  . Not on file    Vitals:   04/15/18 1109  BP: 140/90  Pulse: 64  Resp: 12  Temp: 98.5 F (36.9 C)  SpO2: 99%   Body mass index is 27.52 kg/m.   Physical Exam  Nursing note and vitals reviewed. Constitutional: He is oriented to person, place, and time. He appears well-developed and well-nourished. No distress.  HENT:  Head: Normocephalic and atraumatic.  Mouth/Throat: Oropharynx is clear and moist and mucous membranes are normal.  Eyes: Pupils are equal, round, and reactive to light. Conjunctivae are normal.  Cardiovascular: Normal rate and regular rhythm.  No murmur heard. Pulses:      Posterior tibial pulses are 2+ on the right side and 2+ on the left side.  Respiratory: Effort normal and breath sounds normal. No respiratory distress.  GI: Soft. He exhibits no mass. There is no hepatomegaly. There is no abdominal tenderness.  Musculoskeletal:        General: No edema.     Left knee: He exhibits no swelling and no erythema. No tenderness found.     Comments: Left knee: on inspection no effusion, erythema, or deformities. Valgus and varus stress normal, McMurray negative lateral click with maneuver,no pain., anterior and posterior drawer test negative. Patellar apprehension test negative. Mild crepitus.  Lymphadenopathy:    He has no cervical adenopathy.  Neurological: He is alert and oriented to person, place, and time. He has normal strength. No cranial nerve deficit.  Non antalgic gail.  Skin: Skin is warm. No rash noted. No erythema.  Psychiatric: He has a normal mood and affect.  Well groomed, good eye contact.     ASSESSMENT AND PLAN:  [name] was seen today for left knee pain.  Orders Placed This Encounter  Procedures  . Flu Vaccine QUAD 36+ mos IM  . Basic metabolic panel  . Hemoglobin A1c   Lab Results  Component Value Date    CREATININE 0.68 04/15/2018   BUN 12 04/15/2018   NA 141 04/15/2018   K 3.8 04/15/2018   CL 103 04/15/2018   CO2 29 04/15/2018   Lab Results  Component Value Date   HGBA1C 5.8 04/15/2018     Acute pain of left knee We discussed possible etiologies. Because no history of trauma, I do not think any imaging is needed at this time. OTC menthol-like products, icy hot or Aspercreme, as needed. Also recommend activities that he is had identified as a trigger factors for pain. Follow-up as needed.  Essential hypertension Slightly elevated BP. No changes in  current management. Some side effects of NSAIDs discussed. Continue low-salt diet. Follow-up in 6 months, before if needed.   High blood sugar Recommend a healthy lifestyle for primary prevention. Further recommendation will be given according to A1c results.  -     Hemoglobin A1c  Need for immunization against influenza -     Flu Vaccine QUAD 36+ mos IM    Return in about 7 months (around 11/14/2018) for f/u and routine medicare.      Lashaye Fisk G. Martinique, MD  [facility]. [place] office.

## 2018-04-15 NOTE — Patient Instructions (Signed)
A few things to remember from today's visit:   Acute pain of left knee  Essential hypertension - Plan: Basic metabolic panel  High blood sugar - Plan: Hemoglobin A1c  Topical icy hot or Aspercreme with lidocaine may help. I recommend trying Tylenol arthritis instead Aleve. Avoid activities that may exacerbate pain.  Please be sure medication list is accurate. If a new problem present, please set up appointment sooner than planned today.

## 2018-04-18 ENCOUNTER — Other Ambulatory Visit: Payer: Self-pay | Admitting: *Deleted

## 2018-04-18 ENCOUNTER — Ambulatory Visit: Payer: Self-pay | Admitting: Family Medicine

## 2018-04-18 DIAGNOSIS — I10 Essential (primary) hypertension: Secondary | ICD-10-CM

## 2018-04-18 MED ORDER — ATENOLOL-CHLORTHALIDONE 50-25 MG PO TABS: ORAL_TABLET | ORAL | 2 refills | Status: DC

## 2018-04-22 MED ORDER — AMLODIPINE BESYLATE 5 MG PO TABS: 5.0000 mg | ORAL_TABLET | Freq: Every day | ORAL | 1 refills | Status: DC

## 2018-04-26 ENCOUNTER — Encounter: Payer: Self-pay | Admitting: *Deleted

## 2018-05-02 ENCOUNTER — Telehealth: Payer: Self-pay | Admitting: Family Medicine

## 2018-05-02 NOTE — Telephone Encounter (Signed)
Pt given lab results per notes of Dr Martinique on 04/15/18. Pt verbalized understanding.

## 2018-05-02 NOTE — Telephone Encounter (Unsigned)
Copied from [place] [id number]. Topic: Quick Communication - Lab Results (Clinic Use ONLY) >> Apr 22, 2018 10:39 AM Zacarias Pontes, CMA wrote: Called patient to inform them of their lab results. When patient returns call, triage nurse may disclose results. >> May 02, 2018 11:31 AM Alanda Slim E wrote: Pt called in to get his lab results/ no nurse available/ please advise

## 2018-06-01 ENCOUNTER — Encounter: Payer: Self-pay | Admitting: Family Medicine

## 2018-06-01 ENCOUNTER — Ambulatory Visit (INDEPENDENT_AMBULATORY_CARE_PROVIDER_SITE_OTHER): Payer: 59 | Admitting: Family Medicine

## 2018-06-01 VITALS — BP 130/80 | HR 70 | Temp 98.5°F | Resp 12 | Ht 69.0 in | Wt 189.5 lb

## 2018-06-01 DIAGNOSIS — H6121 Impacted cerumen, right ear: Secondary | ICD-10-CM | POA: Diagnosis not present

## 2018-06-01 DIAGNOSIS — H9191 Unspecified hearing loss, right ear: Secondary | ICD-10-CM

## 2018-06-01 NOTE — Progress Notes (Signed)
ACUTE VISIT   HPI:  Chief Complaint  Patient presents with  . Hearing Problem    ringing in right ear that started Sunday    [name] is a 63 y.o. male, who is here today complaining of gradual right ear hearing loss. He has not noted fever,chills,sore throat, earache,or ear drainage.  No Hx of trauma. No recent travel or URI.  He has not had similar problem before. + Noise exposure through work,he wears earplug.   Hearing Screening   125Hz  250Hz  500Hz  1000Hz  2000Hz  3000Hz  4000Hz  6000Hz  8000Hz   Right ear:   Pass Fail Fail  Fail    Left ear:   Pass Pass Pass  Pass      Review of Systems  Constitutional: Negative for chills and fever.  HENT: Positive for hearing loss. Negative for congestion, ear discharge, ear pain, postnasal drip, rhinorrhea, sore throat, tinnitus and trouble swallowing.   Respiratory: Negative for cough.   Neurological: Negative for dizziness, weakness and headaches.      Current Outpatient Medications on File Prior to Visit  Medication Sig Dispense Refill  . amLODipine (NORVASC) 5 MG tablet Take 1 tablet (5 mg total) by mouth daily. 90 tablet 1  . aspirin 81 MG tablet Take 81 mg by mouth daily.      Marland Kitchen atenolol-chlorthalidone (TENORETIC) 50-25 MG tablet TAKE 1 TABLET BY MOUTH EVERY DAY IN THE MORNING 90 tablet 2  . atorvastatin (LIPITOR) 20 MG tablet Take 1 tablet (20 mg total) by mouth daily. 90 tablet 1  . cyclobenzaprine (FLEXERIL) 10 MG tablet Take 1 tablet (10 mg total) by mouth at bedtime as needed for muscle spasms. 30 tablet 2  . diclofenac sodium (VOLTAREN) 1 % GEL Apply 4 g topically 4 (four) times daily. 4 Tube 3  . fluticasone (FLONASE) 50 MCG/ACT nasal spray Place 2 sprays into both nostrils daily. 16 g 6   No current facility-administered medications on file prior to visit.      Past Medical History:  Diagnosis Date  . Arthritis   . Hypertension    No Known Allergies  Social History   Socioeconomic History  .  Marital status: Married    Spouse name: Not on file  . Number of children: Not on file  . Years of education: Not on file  . Highest education level: Not on file  Occupational History  . Not on file  Social Needs  . Financial resource strain: Not on file  . Food insecurity:    Worry: Not on file    Inability: Not on file  . Transportation needs:    Medical: Not on file    Non-medical: Not on file  Tobacco Use  . Smoking status: Current Every Day Smoker    Years: 35.00    Types: Cigarettes  . Smokeless tobacco: Never Used  . Tobacco comment: 10 cigarettes per day  Substance and Sexual Activity  . Alcohol use: Yes    Alcohol/week: 6.0 standard drinks    Types: 6 Cans of beer per week    Comment: q 2 weeks  . Drug use: No  . Sexual activity: Yes  Lifestyle  . Physical activity:    Days per week: Not on file    Minutes per session: Not on file  . Stress: Not on file  Relationships  . Social connections:    Talks on phone: Not on file    Gets together: Not on file    Attends religious  service: Not on file    Active member of club or organization: Not on file    Attends meetings of clubs or organizations: Not on file    Relationship status: Not on file  Other Topics Concern  . Not on file  Social History Narrative  . Not on file    Vitals:   06/01/18 1632  BP: 130/80  Pulse: 70  Resp: 12  Temp: 98.5 F (36.9 C)  SpO2: 98%   Body mass index is 27.98 kg/m.   Physical Exam  Nursing note and vitals reviewed. Constitutional: He appears well-developed and well-nourished.  HENT:  Head: Normocephalic and atraumatic.  Right Ear: Decreased hearing is noted.  Left Ear: Hearing, tympanic membrane, external ear and ear canal normal.  Cerumen excess,impacted,right ear.  Eyes: Conjunctivae are normal.  Cardiovascular: Normal rate and regular rhythm.  Respiratory: Effort normal. No respiratory distress.  Lymphadenopathy:       Head (right side): No preauricular and  no posterior auricular adenopathy present.       Head (left side): No preauricular and no posterior auricular adenopathy present.    He has no cervical adenopathy.  Psychiatric: His mood appears anxious.  Well groomed,good eye contact.    ASSESSMENT AND PLAN:   [name] was seen today for hearing problem.  Diagnoses and all orders for this visit:  Hearing loss of right ear, unspecified hearing loss type  Impacted cerumen of right ear   After verbal consent he had ear lavage right ear, not improvements. With a curette I removed part of cerumen in ear canal. He tolerated procedure well. TM seen partially.  Hearing improved.     Hearing Screening   125Hz  250Hz  500Hz  1000Hz  2000Hz  3000Hz  4000Hz  6000Hz  8000Hz   Right ear:   Pass Pass Pass  Pass    Left ear:   Pass Pass Pass  Pass      Recommend avoiding Qtips. He may need to consider change from ear plugs to earmuffs. OTC Debrox a few times per week.   He voices understanding.    Return if symptoms worsen or fail to improve.      Miro Balderson G. Martinique, MD  [facility]. [place] office.

## 2018-06-01 NOTE — Patient Instructions (Signed)
A few things to remember from today's visit:   Hearing loss of right ear, unspecified hearing loss type  Impacted cerumen of right ear  Colace (docusate sodium) open capsule and apply 2 to 3 drops in right ear or you can use over-the-counter Debrox in right ear. Avoid Q-tips. Earplugs at work can aggravate problems.  Please be sure medication list is accurate. If a new problem present, please set up appointment sooner than planned today.

## 2018-07-11 ENCOUNTER — Encounter: Payer: Self-pay | Admitting: Family Medicine

## 2018-07-11 ENCOUNTER — Ambulatory Visit (INDEPENDENT_AMBULATORY_CARE_PROVIDER_SITE_OTHER): Payer: 59

## 2018-07-11 ENCOUNTER — Ambulatory Visit (INDEPENDENT_AMBULATORY_CARE_PROVIDER_SITE_OTHER): Payer: 59 | Admitting: Family Medicine

## 2018-07-11 ENCOUNTER — Encounter: Payer: Self-pay | Admitting: *Deleted

## 2018-07-11 VITALS — BP 140/90 | HR 83 | Temp 98.2°F | Resp 12 | Ht 69.0 in | Wt 191.2 lb

## 2018-07-11 DIAGNOSIS — G8929 Other chronic pain: Secondary | ICD-10-CM | POA: Diagnosis not present

## 2018-07-11 DIAGNOSIS — M545 Low back pain: Secondary | ICD-10-CM

## 2018-07-11 NOTE — Progress Notes (Signed)
ACUTE VISIT   HPI:  Chief Complaint  Patient presents with  . Back Pain    recurrent lower back pain    Jeffrey Wagner is a 63 y.o. male, who is here today complaining of recurrent lower back pain.  He denies any recent injury or unusual level of activity.  Pain is not radiated, achy/sharp like, "really bad", with no associated LE numbness, tingling, urinary incontinence or retention, stool incontinence, or saddle anesthesia.  Exacerbated by sneezing,certain movements,and bending. His jobs involves frequent bending and lifting,mixing paint. Alleviated by rest. Sometimes he has pain while in bed.  No rash or edema on area, fever, chills, or abnormal wt loss. Negative for urinary symptoms. Flexeril has helped.   OTC medications: None   Review of Systems  Constitutional: Negative for appetite change, chills, fatigue and fever.  Gastrointestinal: Negative for abdominal pain, nausea and vomiting.  Genitourinary: Negative for decreased urine volume, difficulty urinating, dysuria, hematuria and urgency.  Musculoskeletal: Positive for back pain. Negative for gait problem and neck pain.  Skin: Negative for color change and rash.  Neurological: Negative for weakness, numbness and headaches.    Current Outpatient Medications on File Prior to Visit  Medication Sig Dispense Refill  . amLODipine (NORVASC) 5 MG tablet Take 1 tablet (5 mg total) by mouth daily. 90 tablet 1  . aspirin 81 MG tablet Take 81 mg by mouth daily.      Marland Kitchen atenolol-chlorthalidone (TENORETIC) 50-25 MG tablet TAKE 1 TABLET BY MOUTH EVERY DAY IN THE MORNING 90 tablet 2  . atorvastatin (LIPITOR) 20 MG tablet Take 1 tablet (20 mg total) by mouth daily. 90 tablet 1  . diclofenac sodium (VOLTAREN) 1 % GEL Apply 4 g topically 4 (four) times daily. 4 Tube 3  . fluticasone (FLONASE) 50 MCG/ACT nasal spray Place 2 sprays into both nostrils daily. 16 g 6   No current facility-administered medications on file prior  to visit.      Past Medical History:  Diagnosis Date  . Arthritis   . Hypertension    No Known Allergies  Social History   Socioeconomic History  . Marital status: Married    Spouse name: Not on file  . Number of children: Not on file  . Years of education: Not on file  . Highest education level: Not on file  Occupational History  . Not on file  Social Needs  . Financial resource strain: Not on file  . Food insecurity:    Worry: Not on file    Inability: Not on file  . Transportation needs:    Medical: Not on file    Non-medical: Not on file  Tobacco Use  . Smoking status: Current Every Day Smoker    Years: 35.00    Types: Cigarettes  . Smokeless tobacco: Never Used  . Tobacco comment: 10 cigarettes per day  Substance and Sexual Activity  . Alcohol use: Yes    Alcohol/week: 6.0 standard drinks    Types: 6 Cans of beer per week    Comment: q 2 weeks  . Drug use: No  . Sexual activity: Yes  Lifestyle  . Physical activity:    Days per week: Not on file    Minutes per session: Not on file  . Stress: Not on file  Relationships  . Social connections:    Talks on phone: Not on file    Gets together: Not on file    Attends religious service: Not on file  Active member of club or organization: Not on file    Attends meetings of clubs or organizations: Not on file    Relationship status: Not on file  Other Topics Concern  . Not on file  Social History Narrative  . Not on file    Vitals:   07/11/18 1612  BP: 140/90  Pulse: 83  Resp: 12  Temp: 98.2 F (36.8 C)  SpO2: 98%   Body mass index is 28.24 kg/m.   Physical Exam  Nursing note and vitals reviewed. Constitutional: He is oriented to person, place, and time. He appears well-developed. No distress.  HENT:  Head: Normocephalic and atraumatic.  Eyes: Conjunctivae are normal.  Cardiovascular: Normal rate and regular rhythm.  Respiratory: Effort normal and breath sounds normal. No respiratory  distress.  GI: Soft. He exhibits no mass. There is no abdominal tenderness.  Musculoskeletal:        General: No edema.     Lumbar back: He exhibits spasm. He exhibits no tenderness and no bony tenderness.       Back:     Comments: No significant deformity appreciated. No tenderness upon palpation of paraspinal muscles. Pain is not elicited with movement on exam table during examination. No local edema or erythema appreciated, no suspicious lesions.   Neurological: He is alert and oriented to person, place, and time. He has normal strength.  Skin: Skin is warm. No erythema.  Psychiatric: He has a normal mood and affect.  Well groomed,good eye contact.    ASSESSMENT AND PLAN:  Jeffrey Wagner was seen today for back pain.  Diagnoses and all orders for this visit:  Chronic bilateral low back pain without sciatica -     DG Lumbar Spine Complete; Future -     Ambulatory referral to Physical Therapy -     cyclobenzaprine (FLEXERIL) 10 MG tablet; Take 1 tablet (10 mg total) by mouth at bedtime as needed for muscle spasms.   Getting worse. We dicussed possible etiologies,most likely OA related; prognosis and treatment options. Lumbar X K[place]an ordered today. He is not interested in ortho referral but agrees with trying PT. Side effects of Flexeril discussed.  Instructed about warning signs.    Return if symptoms worsen or fail to improve, for Keep next follow-up appointment..       G. Martinique, MD  [facility]. [place] office.

## 2018-07-11 NOTE — Patient Instructions (Addendum)
A few things to remember from today's visit:   Chronic bilateral low back pain without sciatica - Plan: DG Lumbar Spine Complete, Ambulatory referral to Physical Therapy  Tylenol arthritis 3 times per day as needed. Continue Flexeril at bedtime. Local ice may also help. Appointment with physical therapist will be arranged.  Please be sure medication list is accurate. If a new problem present, please set up appointment sooner than planned today.

## 2018-07-13 ENCOUNTER — Ambulatory Visit: Payer: 59 | Attending: Family Medicine | Admitting: Physical Therapy

## 2018-07-13 ENCOUNTER — Other Ambulatory Visit: Payer: Self-pay

## 2018-07-13 DIAGNOSIS — M545 Low back pain: Secondary | ICD-10-CM | POA: Insufficient documentation

## 2018-07-13 DIAGNOSIS — M6281 Muscle weakness (generalized): Secondary | ICD-10-CM | POA: Diagnosis present

## 2018-07-13 DIAGNOSIS — G8929 Other chronic pain: Secondary | ICD-10-CM | POA: Diagnosis present

## 2018-07-13 DIAGNOSIS — R293 Abnormal posture: Secondary | ICD-10-CM | POA: Insufficient documentation

## 2018-07-13 NOTE — Patient Instructions (Signed)

## 2018-07-15 MED ORDER — CYCLOBENZAPRINE HCL 10 MG PO TABS: 10.0000 mg | ORAL_TABLET | Freq: Every evening | ORAL | 2 refills | Status: DC | PRN

## 2018-07-15 NOTE — Therapy (Signed)
[facility] [address], [place], [place], [address] Phone: [phone]   Fax:  [phone]  Physical Therapy Evaluation  Patient Details  Name: Jeffrey Wagner MRN: [id number] Date of Birth: [birth year] Referring Provider (PT): Martinique, Betty G, MD   Encounter Date: 07/13/2018  PT End of Session - 07/15/18 1258    Visit Number  1    Date for PT Re-Evaluation  09/07/18    PT Start Time  0931    PT Stop Time  1010    PT Time Calculation (min)  39 min    Activity Tolerance  Patient tolerated treatment well    Behavior During Therapy  [facility] for tasks assessed/performed       Past Medical History:  Diagnosis Date  . Arthritis   . Hypertension     No past surgical history on file.  There were no vitals filed for this visit.   Subjective Assessment - 07/15/18 1257    Subjective  Pt states he has low back pain that comes on from middle to end of the work day.  Pt states he has been having the pain off and on for 2 years and chiro helped.  Started 2 weeks ago worse when did a lot of lifting at work.    Limitations  Other (comment)   work   Patient Stated Goals  be able to work without extreme pain at the end of the day    Currently in Pain?  Yes    Pain Score  10-Worst pain ever   after work   Pain Location  Back    Pain Orientation  Left;Lower    Pain Descriptors / Indicators  Sharp    Pain Type  Chronic pain;Acute pain    Pain Radiating Towards  left hip    Pain Onset  1 to 4 weeks ago   flare up 2 weeks   Pain Frequency  Intermittent    Aggravating Factors   work    Pain Relieving Factors  lying down for a couple hours    Multiple Pain Sites  No         [facility] PT Assessment - 07/15/18 0001      Assessment   Medical Diagnosis  M54.5,G89.29 (ICD-10-CM) - Chronic bilateral low back pain without sciatica    Referring Provider (PT)  Martinique, Betty G, MD    Onset Date/Surgical Date  --   2 weeks ago   Prior  Therapy  No      Precautions   Precautions  None      Balance Screen   Has the patient fallen in the past 6 months  No      [place] residence    Living Arrangements  Spouse/significant other      Prior Function   Level of Independence  Independent    Vocation  Full time employment    Vocation Requirements  make paint for [employer]      Cognition   Overall Cognitive Status  Within Functional Limits for tasks assessed      Observation/Other Assessments   Focus on Therapeutic Outcomes (FOTO)   17% limited      Functional Tests   Functional tests  Squat      Squat   Comments  increased lumbar lordisis some trunk rotation when simulating lifting at work      Posture/Postural Control   Posture/Postural Control  Postural limitations  Postural Limitations  Increased lumbar lordosis      ROM / Strength   AROM / PROM / Strength  AROM;Strength;PROM      AROM   Overall AROM Comments  lumbar flexion 50% barely reaches finger tip to floor; other ROM WFL      PROM   Overall PROM Comments  hiP ER 50% Rt; 75% limited Lt      Strength   Overall Strength Comments  postural strength and core strength limited      Flexibility   Soft Tissue Assessment /Muscle Length  yes    Hamstrings  normal      Palpation   Palpation comment  lumbar paraspinals and thoracolumbar fascial restricted and tight      Ambulation/Gait   Gait Pattern  Within Functional Limits                Objective measurements completed on examination: See above findings.      [facility] Adult PT Treatment/Exercise - 07/15/18 0001      Self-Care   Self-Care  Other Self-Care Comments    Other Self-Care Comments   intial HEP, lifting technques             PT Education - 07/15/18 1203    Education Details   Access Code: [email or web address]     Person(s) Educated  Patient    Methods  Demonstration;Verbal cues;Handout;Explanation    Comprehension  Verbalized  understanding;Returned demonstration       PT Short Term Goals - 07/15/18 1212      PT SHORT TERM GOAL #1   Title  pt will be ind with initial HEP    Time  4    Period  Weeks    Status  New    Target Date  08/10/18        PT Long Term Goals - 07/15/18 1204      PT LONG TERM GOAL #1   Title  Pt will report he is able to work full shift with 50% less back pain at the end of the shift    Time  8    Period  Weeks    Status  New    Target Date  09/07/18      PT LONG TERM GOAL #2   Title  Pt will be ind with advanced HEP    Time  8    Period  Weeks    Status  New    Target Date  09/07/18      PT LONG TERM GOAL #3   Title  Pt will demonstrate improved core strength so he can correctly lift 30-40lb with good body mechanics    Time  8    Period  Weeks    Status  New    Target Date  09/07/18      PT LONG TERM GOAL #4   Title  Pt will demonstrate increased forward flexion to increased ROM so he can reach second knuckle to the floor for greater ease of functional bending movements    Time  8    Period  Weeks    Status  New    Target Date  09/07/18             Plan - 07/15/18 1213    Clinical Impression Statement  Pt presents to PT due to chronic low back pain that has recently increased.  Pain is not radiating into hip or buttocks and only on left side.  Pt  has low irritability and SLR or passive accesory motion of joints in lumbar spine did not reporduce pain.  Pt has decreased PROM and AROM in lumbar flexion and hips external rotation.  pt has increased lumbar lordosis and fascial and muslce restriction in lumbar spine.  Pt has decreased knowledge of correct lifting technique and demonstrates some rotation when showing PT how he moves heavy bags at his job.  Pt will benefit from skilled PT to improve soft tissue mobility and address impairments listed so he can return to full function at work without increased pain.    Personal Factors and Comorbidities  Profession;Age     Examination-Activity Limitations  Lift    Examination-Participation Restrictions  Other    Stability/Clinical Decision Making  Evolving/Moderate complexity    Clinical Decision Making  Low    Rehab Potential  Excellent    PT Frequency  2x / week    PT Duration  8 weeks    PT Treatment/Interventions  Spinal Manipulations;Joint Manipulations;Taping;Dry needling;Manual techniques;Patient/family education;Neuromuscular re-education;Balance training;Therapeutic exercise;Therapeutic activities;Traction    PT Next Visit Plan  DN to lumbar paraspinals and left gluteals, core strengthening, lumbar traction    PT Home Exercise Plan   Access Code: [email or web address]     Consulted and Agree with Plan of Care  Patient       Patient will benefit from skilled therapeutic intervention in order to improve the following deficits and impairments:  Pain, Improper body mechanics, Increased fascial restricitons, Decreased strength, Decreased range of motion, Postural dysfunction  Visit Diagnosis: Chronic left-sided low back pain without sciatica  Muscle weakness (generalized)  Abnormal posture     Problem List Patient Active Problem List   Diagnosis Date Noted  . Right shoulder pain 07/27/2017  . Hyperlipidemia 07/26/2017  . CNTC DERMATITIS&OTH ECZEMA DUE OTH CHEM PRODUCTS 11/05/2009  . ERECTILE DYSFUNCTION, MILD 06/23/2007  . TOBACCO ABUSE 02/15/2007  . Essential hypertension 12/27/2006  . OSTEOARTHRITIS 12/27/2006  . LOW BACK PAIN 12/27/2006    Jule Ser, PT 07/15/2018, 12:58 PM  [facility] [address], [place], [place], [address] Phone: [phone]   Fax:  [phone]  Name: Jeffrey Wagner MRN: [id number] Date of Birth: 1956-03-05

## 2018-07-18 ENCOUNTER — Other Ambulatory Visit: Payer: Self-pay

## 2018-07-18 ENCOUNTER — Encounter: Payer: Self-pay | Admitting: Physical Therapy

## 2018-07-18 ENCOUNTER — Ambulatory Visit: Payer: 59 | Admitting: Physical Therapy

## 2018-07-18 DIAGNOSIS — M545 Low back pain: Secondary | ICD-10-CM | POA: Diagnosis not present

## 2018-07-18 DIAGNOSIS — G8929 Other chronic pain: Secondary | ICD-10-CM

## 2018-07-18 DIAGNOSIS — M6281 Muscle weakness (generalized): Secondary | ICD-10-CM

## 2018-07-18 DIAGNOSIS — R293 Abnormal posture: Secondary | ICD-10-CM

## 2018-07-18 NOTE — Therapy (Addendum)
[facility] [address] [place], [place], [address] Phone: [phone]   Fax:  [phone]  Physical Therapy Treatment  Patient Details  Name: Jeffrey Wagner MRN: [id number] Date of Birth: 11-01-1955 Referring Provider (PT): Martinique, Betty G, MD   Encounter Date: 07/18/2018  PT End of Session - 07/18/18 1524    Visit Number  2    Date for PT Re-Evaluation  09/07/18    PT Start Time  7473    PT Stop Time  1624   extra time for needling and traction set up   PT Time Calculation (min)  60 min    Activity Tolerance  Patient tolerated treatment well    Behavior During Therapy  [facility] for tasks assessed/performed       Past Medical History:  Diagnosis Date  . Arthritis   . Hypertension     History reviewed. No pertinent surgical history.  There were no vitals filed for this visit.  Subjective Assessment - 07/18/18 1527    Subjective  Pt states he didn't have any pain, just a little stiffness after work today.  Have been doing the stretches and feeling better overall.    Patient Stated Goals  be able to work without extreme pain at the end of the day    Currently in Pain?  No/denies                       [facility] Adult PT Treatment/Exercise - 07/18/18 0001      Exercises   Exercises  Lumbar      Lumbar Exercises: Prone   Other Prone Lumbar Exercises  child pose - 3 x 30 sec with and without pillows      Lumbar Exercises: Quadruped   [place]/Old Horse  10 reps      Modalities   Modalities  Traction      Traction   Type of Traction  Lumbar    Min (lbs)  20    Max (lbs)  70    Hold Time  60    Rest Time  10    Time  16      Manual Therapy   Manual Therapy  Soft tissue mobilization;Myofascial release    Soft tissue mobilization  lumbar and thoracic paraspinals, left gluteals    Myofascial Release  fascial pull on thoracolumbar fascia       Trigger Point Dry Needling - 07/18/18 0001    Consent Given?  Yes    Education Handout Provided  Yes    Muscles Treated Back/Hip  Lumbar multifidi    Lumbar multifidi Response  Palpable increased muscle length           PT Education - 07/18/18 1620    Education Details   Access Code: [email or web address] and dry needling info    Person(s) Educated  Patient    Methods  Explanation;Demonstration;Handout;Verbal cues    Comprehension  Verbalized understanding;Returned demonstration       PT Short Term Goals - 07/18/18 1637      PT SHORT TERM GOAL #1   Title  pt will be ind with initial HEP    Status  Achieved        PT Long Term Goals - 07/15/18 1204      PT LONG TERM GOAL #1   Title  Pt will report he is able to work full shift with 50% less back pain at the end of the shift  Time  8    Period  Weeks    Status  New    Target Date  09/07/18      PT LONG TERM GOAL #2   Title  Pt will be ind with advanced HEP    Time  8    Period  Weeks    Status  New    Target Date  09/07/18      PT LONG TERM GOAL #3   Title  Pt will demonstrate improved core strength so he can correctly lift 30-40lb with good body mechanics    Time  8    Period  Weeks    Status  New    Target Date  09/07/18      PT LONG TERM GOAL #4   Title  Pt will demonstrate increased forward flexion to increased ROM so he can reach second knuckle to the floor for greater ease of functional bending movements    Time  8    Period  Weeks    Status  New    Target Date  09/07/18            Plan - 07/18/18 1634    Clinical Impression Statement  Pt responded well from manual with DN.  He did not seem to have significant twitch or expreience trigger point release but it helped aide in soft tissue work.  He got good stretch from childs pose and continues to exhibit difficulty with lumbar flexion.  Pt tolerated traction well.  He will benefit from skilled PT to contiue to address lumbar ROM and reduced muscle spasms.    PT Treatment/Interventions  Spinal  Manipulations;Joint Manipulations;Taping;Dry needling;Manual techniques;Patient/family education;Neuromuscular re-education;Balance training;Therapeutic exercise;Therapeutic activities;Traction    PT Next Visit Plan  f/u on DN and manual to lumbar paraspinals and left gluteals, f/u on lumbar traction, core strengthening and glute strength with lifting technique    PT Home Exercise Plan   Access Code: [email or web address]     Consulted and Agree with Plan of Care  Patient       Patient will benefit from skilled therapeutic intervention in order to improve the following deficits and impairments:  Pain, Improper body mechanics, Increased fascial restricitons, Decreased strength, Decreased range of motion, Postural dysfunction  Visit Diagnosis: Chronic left-sided low back pain without sciatica  Muscle weakness (generalized)  Abnormal posture     Problem List Patient Active Problem List   Diagnosis Date Noted  . Right shoulder pain 07/27/2017  . Hyperlipidemia 07/26/2017  . CNTC DERMATITIS&OTH ECZEMA DUE OTH CHEM PRODUCTS 11/05/2009  . ERECTILE DYSFUNCTION, MILD 06/23/2007  . TOBACCO ABUSE 02/15/2007  . Essential hypertension 12/27/2006  . OSTEOARTHRITIS 12/27/2006  . LOW BACK PAIN 12/27/2006    Jule Ser, PT 07/18/2018, 4:42 PM  [facility] [address] [place], [place], [address] Phone: [phone]   Fax:  [phone]  Name: Jeffrey Wagner MRN: [id number] Date of Birth: 1955/05/16  PHYSICAL THERAPY DISCHARGE SUMMARY  Visits from Start of Care: 2  Current functional level related to goals / functional outcomes:  see above goals  Remaining deficits: See above   Education / Equipment: HEP Plan: Patient agrees to discharge.  Patient goals were not met. Patient is being discharged due to not returning since the last visit.  ?????     [employer], PT 09/10/18 1:09 PM

## 2018-07-18 NOTE — Patient Instructions (Signed)

## 2018-07-28 ENCOUNTER — Encounter: Payer: Self-pay | Admitting: Physical Therapy

## 2018-08-04 ENCOUNTER — Encounter: Payer: Self-pay | Admitting: Physical Therapy

## 2018-08-10 ENCOUNTER — Encounter: Payer: Self-pay | Admitting: Physical Therapy

## 2018-08-17 ENCOUNTER — Encounter: Payer: Self-pay | Admitting: Physical Therapy

## 2018-08-24 ENCOUNTER — Encounter: Payer: Self-pay | Admitting: Physical Therapy

## 2018-09-01 ENCOUNTER — Telehealth: Payer: Self-pay | Admitting: Family Medicine

## 2018-09-01 NOTE — Telephone Encounter (Signed)
Patient dropped off FMLA forms  Fax forms to: [phone]  Disposition: Dr's folder

## 2018-09-02 NOTE — Telephone Encounter (Signed)
Form placed on providers desk for completion

## 2018-09-05 NOTE — Telephone Encounter (Signed)
Spoke with Estill Bamberg at [employer] and she stated that she would fax over [employer] forms for PCP to sign.

## 2018-09-07 NOTE — Telephone Encounter (Signed)
Form placed on providers desk for completion

## 2018-09-13 NOTE — Telephone Encounter (Signed)
Tried calling patient on both numbers on file to inform that paperwork need his signature and there is a $29 fee. Cannot fax forms until he sign.

## 2018-09-15 NOTE — Telephone Encounter (Signed)
Patient called several times on both numbers, unable to leave message, vm isn't set up. Patient has to sign before form can be faxed to employer.

## 2018-09-19 NOTE — Telephone Encounter (Signed)
Tried calling patient, unable to leave message, no voicemail set up on home or mobile numbers on file.

## 2018-09-21 NOTE — Telephone Encounter (Signed)
Tried calling patient concerning forms, unable to leave message, no voicemail set up. Patient has not returned any calls to the office concerning forms.

## 2018-10-06 NOTE — Telephone Encounter (Signed)
Attempted to call patient again about forms. No answer, unable to leave messages. Forms have been placed in folder for whenever patient calls back or come into the office. Forms need patient signature before they could be faxed. Neither patient or employer have called as of today, concerning forms.  Nothing further needed at this time.

## 2019-01-30 ENCOUNTER — Other Ambulatory Visit: Payer: Self-pay | Admitting: Family Medicine

## 2019-01-30 DIAGNOSIS — I10 Essential (primary) hypertension: Secondary | ICD-10-CM

## 2019-03-10 ENCOUNTER — Telehealth: Payer: Self-pay | Admitting: *Deleted

## 2019-03-10 NOTE — Telephone Encounter (Signed)
Copied from [place] [id number]. Topic: General - Other >> Mar 10, 2019  2:18 PM Celene Kras A wrote: Reason for CRM: Pts wife called. Pt is requesting to know if it is time for him to get his colonoscopy. Please advise.

## 2019-03-17 NOTE — Telephone Encounter (Signed)
Patients wife is aware. Nothing further needed.

## 2019-03-17 NOTE — Telephone Encounter (Signed)
Please advise. Last colonoscopy was 12/11/2007

## 2019-03-17 NOTE — Telephone Encounter (Signed)
Referral was placed in 01/2016. Apparently he was due for colonoscopy in 06/2016.   General 01/13/2016 8:26 AM Phoebe Perch, Lorriane Shire - -  Note   Patient is not due for recall colonoscopy until 06/2016 with Dr.Gessner. Left message with family member for patient to call back.          He can go ahead and call GI and schedule colonoscopy. Thanks, BJ

## 2019-05-07 ENCOUNTER — Other Ambulatory Visit: Payer: Self-pay | Admitting: Family Medicine

## 2019-05-07 DIAGNOSIS — I10 Essential (primary) hypertension: Secondary | ICD-10-CM

## 2019-05-12 ENCOUNTER — Other Ambulatory Visit: Payer: Self-pay | Admitting: Family Medicine

## 2019-05-12 DIAGNOSIS — I10 Essential (primary) hypertension: Secondary | ICD-10-CM

## 2019-06-05 ENCOUNTER — Ambulatory Visit: Payer: Self-pay | Attending: Internal Medicine

## 2019-06-05 DIAGNOSIS — Z20822 Contact with and (suspected) exposure to covid-19: Secondary | ICD-10-CM

## 2019-06-06 LAB — NOVEL CORONAVIRUS, NAA: SARS-CoV-2, NAA: NOT DETECTED

## 2019-08-24 ENCOUNTER — Other Ambulatory Visit: Payer: Self-pay | Admitting: Family Medicine

## 2019-08-24 DIAGNOSIS — I10 Essential (primary) hypertension: Secondary | ICD-10-CM

## 2019-11-15 ENCOUNTER — Ambulatory Visit (INDEPENDENT_AMBULATORY_CARE_PROVIDER_SITE_OTHER): Payer: Self-pay | Admitting: Family Medicine

## 2019-11-15 ENCOUNTER — Encounter: Payer: Self-pay | Admitting: Family Medicine

## 2019-11-15 ENCOUNTER — Other Ambulatory Visit: Payer: Self-pay

## 2019-11-15 VITALS — BP 128/80 | HR 74 | Resp 16 | Ht 69.0 in | Wt 188.1 lb

## 2019-11-15 DIAGNOSIS — E785 Hyperlipidemia, unspecified: Secondary | ICD-10-CM

## 2019-11-15 DIAGNOSIS — M25512 Pain in left shoulder: Secondary | ICD-10-CM

## 2019-11-15 DIAGNOSIS — G8929 Other chronic pain: Secondary | ICD-10-CM

## 2019-11-15 DIAGNOSIS — I10 Essential (primary) hypertension: Secondary | ICD-10-CM

## 2019-11-15 DIAGNOSIS — M545 Low back pain: Secondary | ICD-10-CM

## 2019-11-15 DIAGNOSIS — M25511 Pain in right shoulder: Secondary | ICD-10-CM

## 2019-11-15 DIAGNOSIS — R739 Hyperglycemia, unspecified: Secondary | ICD-10-CM

## 2019-11-15 MED ORDER — ATENOLOL-CHLORTHALIDONE 50-25 MG PO TABS: 1.0000 | ORAL_TABLET | Freq: Every day | ORAL | 1 refills | Status: DC

## 2019-11-15 MED ORDER — MELOXICAM 15 MG PO TABS: 15.0000 mg | ORAL_TABLET | Freq: Every day | ORAL | 0 refills | Status: DC

## 2019-11-15 MED ORDER — AMLODIPINE BESYLATE 5 MG PO TABS: 5.0000 mg | ORAL_TABLET | Freq: Every day | ORAL | 1 refills | Status: DC

## 2019-11-15 MED ORDER — ATORVASTATIN CALCIUM 20 MG PO TABS: 20.0000 mg | ORAL_TABLET | Freq: Every day | ORAL | 1 refills | Status: DC

## 2019-11-15 MED ORDER — MELOXICAM 15 MG PO TABS: 15.0000 mg | ORAL_TABLET | Freq: Every day | ORAL | 0 refills | Status: DC | PRN

## 2019-11-15 NOTE — Assessment & Plan Note (Signed)
He would like to start PT again,referral placed.

## 2019-11-15 NOTE — Patient Instructions (Signed)
A few things to remember from today's visit:   Chronic bilateral low back pain without sciatica - Plan: Ambulatory referral to Physical Therapy  Essential hypertension - Plan: Comprehensive metabolic panel  High blood sugar - Plan: Comprehensive metabolic panel, Hemoglobin A1c  Chronic pain of both shoulders - Plan: meloxicam (MOBIC) 15 MG tablet, Ambulatory referral to Physical Therapy  Today Mobic 15 mg added daily as needed. You can also take Tylenol 500 mg 3-4 times daily. Monitor blood pressure at home.  If you need refills please call your pharmacy. Do not use My Chart to request refills or for acute issues that need immediate attention.    Please be sure medication list is accurate. If a new problem present, please set up appointment sooner than planned today.

## 2019-11-15 NOTE — Progress Notes (Signed)
Chief Complaint  Patient presents with  . Shoulder Pain    bilateral, tried the voltaren gel, didn't help   HPI: [name] is a 64 y.o. male, who is here today with above complaint.  Problem has been going on for years. Lateral shoulders achy pain, intermittent, worse when lying in bed. + Stiffness. Pain is "bad" 6-7/10, not radiated. No hx of trauma.  He has not noted UE's numbness,tingling,or weakness Problem is getting worse.  Voltaren gel helps some. No limitation of movement. Right handed.  Chronic lower back pain. No radiated. Exacerbated by certain movements and activities.  He started PT but discontinued due to COVID 19 pandemia. He noted great improvement and would like to resume therapy.  He was last seen for HTN and HLD f/u on 08/02/17. HTN: He is on Atenolol-chlorthalidone 50-25 mg daily and Amlodipine 5 mg daily. He is not checking BP at home.  Negative for severe/frequent headache, visual changes, chest pain, dyspnea, palpitation, claudication, focal weakness, or edema.  Lab Results  Component Value Date   CREATININE 0.68 04/15/2018   BUN 12 04/15/2018   NA 141 04/15/2018   K 3.8 04/15/2018   CL 103 04/15/2018   CO2 29 04/15/2018   HLD: He is on Atorvastatin 20 mg daily. Stopped alcohol intake 4 months ago.  Lab Results  Component Value Date   CHOL 192 08/02/2017   HDL 56.70 08/02/2017   LDLCALC 119 (H) 08/02/2017   LDLDIRECT 143.3 11/24/2010   TRIG 81.0 08/02/2017   CHOLHDL 3 08/02/2017   Prediabetes: Negative for polydipsia,polyuria, or polyphagia.  Lab Results  Component Value Date   HGBA1C 5.8 04/15/2018   Review of Systems  Constitutional: Negative for activity change, appetite change and fever.  HENT: Negative for mouth sores, nosebleeds and sore throat.   Respiratory: Negative for cough and wheezing.   Gastrointestinal: Negative for abdominal pain, nausea and vomiting.  Genitourinary: Negative for decreased urine volume  and hematuria.  Skin: Negative for rash.  Neurological: Negative for syncope, facial asymmetry and weakness.   Rest see pertinent positives and negatives per HPI.  Current Outpatient Medications on File Prior to Visit  Medication Sig Dispense Refill  . aspirin 81 MG tablet Take 81 mg by mouth daily.      . cyclobenzaprine (FLEXERIL) 10 MG tablet Take 1 tablet (10 mg total) by mouth at bedtime as needed for muscle spasms. 30 tablet 2  . fluticasone (FLONASE) 50 MCG/ACT nasal spray Place 2 sprays into both nostrils daily. 16 g 6   No current facility-administered medications on file prior to visit.     Past Medical History:  Diagnosis Date  . Arthritis   . Hypertension    No Known Allergies  Social History   Socioeconomic History  . Marital status: Married    Spouse name: Not on file  . Number of children: Not on file  . Years of education: Not on file  . Highest education level: Not on file  Occupational History  . Not on file  Tobacco Use  . Smoking status: Current Every Day Smoker    Years: 35.00    Types: Cigarettes  . Smokeless tobacco: Never Used  . Tobacco comment: 10 cigarettes per day  Substance and Sexual Activity  . Alcohol use: Yes    Alcohol/week: 6.0 standard drinks    Types: 6 Cans of beer per week    Comment: q 2 weeks  . Drug use: No  . Sexual activity: Yes  Other Topics Concern  . Not on file  Social History Narrative  . Not on file   Social Determinants of Health   Financial Resource Strain:   . Difficulty of Paying Living Expenses:   Food Insecurity:   . Worried About Charity fundraiser in the Last Year:   . Arboriculturist in the Last Year:   Transportation Needs:   . Film/video editor (Medical):   Marland Kitchen Lack of Transportation (Non-Medical):   Physical Activity:   . Days of Exercise per Week:   . Minutes of Exercise per Session:   Stress:   . Feeling of Stress :   Social Connections:   . Frequency of Communication with Friends  and Family:   . Frequency of Social Gatherings with Friends and Family:   . Attends Religious Services:   . Active Member of Clubs or Organizations:   . Attends Archivist Meetings:   Marland Kitchen Marital Status:     Vitals:   11/15/19 1453  BP: 128/80  Pulse: 74  Resp: 16  SpO2: 98%   Body mass index is 27.78 kg/m.  Physical Exam Nursing note reviewed.  Constitutional:      General: He is not in acute distress.    Appearance: He is well-developed.  HENT:     Head: Normocephalic and atraumatic.  Eyes:     Conjunctiva/sclera: Conjunctivae normal.  Cardiovascular:     Rate and Rhythm: Normal rate and regular rhythm.     Heart sounds: No murmur heard.   Pulmonary:     Effort: Pulmonary effort is normal. No respiratory distress.     Breath sounds: Normal breath sounds.  Abdominal:     Palpations: Abdomen is soft. There is no hepatomegaly or mass.     Tenderness: There is no abdominal tenderness.  Musculoskeletal:     Comments: Shoulders: No deformity, edema, or erythema appreciated. Luan Pulling' test negative, empty can supraspinatus test mild pain elicited, cross body adduction, lift-Off Subscapularis test elicits mild pain. ROM: Mildly limited active>passive abduction.   Lymphadenopathy:     Cervical: No cervical adenopathy.  Skin:    General: Skin is warm.     Findings: No erythema or rash.  Neurological:     Mental Status: He is alert and oriented to person, place, and time.     Cranial Nerves: No cranial nerve deficit.     Gait: Gait normal.  Psychiatric:     Comments: Well groomed, good eye contact.    ASSESSMENT AND PLAN:  [name] was seen today for shoulder pain.  Diagnoses and all orders for this visit:  Orders Placed This Encounter  Procedures  . Comprehensive metabolic panel  . Hemoglobin A1c  . Ambulatory referral to Physical Therapy    Essential hypertension BP adequately controlled. No changes in current management. Recommend monitoring  BP. Continue low salt diet.   Hyperlipidemia He is reporting compliance with Atorvastatin, last refill in 2019 #90/1. Continue current management. He is not fasting today,so we will plan on checking next visit.  LOW BACK PAIN He would like to start PT again,referral placed.  Hyperglycemia Further recommendations according to HgA1C results. Healthy life style recommended for primary prevention.  Chronic pain of both shoulders We discussed possible etiologies. OA most likely. Rotator cuff tendinitis. I do not think imaging is needed at this time. After discussion of some side effects of chronic NSAID's use ,he agrees with trying Meloxicam. Tylenol 500 mg 3-4 tabs daily. PT will be  arranged.  -     meloxicam (MOBIC) 15 MG tablet; Take 1 tablet (15 mg total) by mouth daily as needed for pain.   Return in about 3 months (around 02/15/2020) for HLD,pain,HTN.   Brendan Gadson G. Martinique, MD  [facility]. [place] office.  A few things to remember from today's visit:  Today Mobic 15 mg added daily as needed. You can also take Tylenol 500 mg 3-4 times daily. Monitor blood pressure at home.  If you need refills please call your pharmacy. Do not use My Chart to request refills or for acute issues that need immediate attention.    Please be sure medication list is accurate. If a new problem present, please set up appointment sooner than planned today.

## 2019-11-15 NOTE — Assessment & Plan Note (Signed)
He is reporting compliance with Atorvastatin, last refill in 2019 #90/1. Continue current management. He is not fasting today,so we will plan on checking next visit.

## 2019-11-15 NOTE — Assessment & Plan Note (Signed)
BP adequately controlled. No changes in current management. Recommend monitoring BP. Continue low salt diet.

## 2020-02-13 ENCOUNTER — Encounter: Payer: Self-pay | Admitting: Family Medicine

## 2020-02-13 ENCOUNTER — Other Ambulatory Visit: Payer: Self-pay

## 2020-02-13 ENCOUNTER — Ambulatory Visit (INDEPENDENT_AMBULATORY_CARE_PROVIDER_SITE_OTHER): Payer: BC Managed Care – PPO | Admitting: Family Medicine

## 2020-02-13 VITALS — BP 124/80 | HR 82 | Resp 16 | Ht 69.0 in | Wt 189.1 lb

## 2020-02-13 DIAGNOSIS — E785 Hyperlipidemia, unspecified: Secondary | ICD-10-CM | POA: Diagnosis not present

## 2020-02-13 DIAGNOSIS — Z23 Encounter for immunization: Secondary | ICD-10-CM | POA: Diagnosis not present

## 2020-02-13 DIAGNOSIS — M545 Low back pain, unspecified: Secondary | ICD-10-CM

## 2020-02-13 DIAGNOSIS — M25511 Pain in right shoulder: Secondary | ICD-10-CM

## 2020-02-13 DIAGNOSIS — I10 Essential (primary) hypertension: Secondary | ICD-10-CM | POA: Diagnosis not present

## 2020-02-13 DIAGNOSIS — M159 Polyosteoarthritis, unspecified: Secondary | ICD-10-CM | POA: Diagnosis not present

## 2020-02-13 DIAGNOSIS — G8929 Other chronic pain: Secondary | ICD-10-CM

## 2020-02-13 DIAGNOSIS — F17200 Nicotine dependence, unspecified, uncomplicated: Secondary | ICD-10-CM

## 2020-02-13 DIAGNOSIS — M25512 Pain in left shoulder: Secondary | ICD-10-CM

## 2020-02-13 DIAGNOSIS — I700 Atherosclerosis of aorta: Secondary | ICD-10-CM

## 2020-02-13 MED ORDER — DULOXETINE HCL 30 MG PO CPEP: 30.0000 mg | ORAL_CAPSULE | Freq: Every day | ORAL | 1 refills | Status: DC

## 2020-02-13 NOTE — Progress Notes (Signed)
HPI: [name] is a 64 y.o. male, who is here today for follow up.   He was last seen on 11/15/19 for lower back pain. Pain is still "bad." Mobic did not help much.  No new associated symptom. Pain is not radiated. Negative for fever,saddle anesthesia,or bladder/bowel dysfunction. He taking 3-4 tylenol am and pm. +Stiffness. He would like to go to ortho before he retires.   Lumbar X Jeffrey Wagner 07/2018: 1. No significant lumbar disc space narrowing. Scattered osteophytes. 2. Mild L4-5 facet degenerative changes. 3.  Aortic Atherosclerosis (ICD10-I70.0).  + Shoulder and hip pain. Shoulder pain getting worse.  Pain exacerbated by movement and prolonged walking, respectively. No neck pain,numbness,or tingling.  + Smoker. Started smoking around 64 yo. Smokes 1 PPD.  HTN: He is on Atenolol-HCTZ 50-25 mg daily and Amlodipine 5 mg daily. He does not check BP at home. He did not stop at the lab before leaving last visit.  Negative for severe/frequent headache, visual changes, chest pain, dyspnea, palpitation, claudication, focal weakness, or edema.   Component     Latest Ref Rng & Units 04/15/2018  Sodium     135 - 145 mEq/L 141  Potassium     3.5 - 5.1 mEq/L 3.8  Chloride     96 - 112 mEq/L 103  CO2     19 - 32 mEq/L 29  Glucose     70 - 99 mg/dL 132 (H)  BUN     6 - 23 mg/dL 12  Creatinine     0.40 - 1.50 mg/dL 0.68  Total Bilirubin     0.2 - 1.2 mg/dL    HLD:He is not taking Atorvastatin, has not taken med since early 2020. No side effects reported.  Component     Latest Ref Rng & Units 08/02/2017  Cholesterol     <200 mg/dL 192  Triglycerides     <150 mg/dL 81.0  HDL Cholesterol     > OR = 40 mg/dL 56.70  VLDL     0.0 - 40.0 mg/dL 16.2  LDL (calc)     0 - 99 mg/dL 119 (H)  Total CHOL/HDL Ratio     <5.0 (calc) 3  NonHDL      135.68   Review of Systems  Constitutional: Negative for activity change, appetite change, fatigue and fever.  HENT:  Negative for mouth sores, nosebleeds and sore throat.   Eyes: Negative for redness and visual disturbance.  Respiratory: Negative for cough and wheezing.   Gastrointestinal: Negative for abdominal pain, nausea and vomiting.  Genitourinary: Negative for decreased urine volume, dysuria and hematuria.  Musculoskeletal: Positive for arthralgias and back pain. Negative for gait problem.  Skin: Negative for pallor and rash.  Neurological: Negative for syncope, facial asymmetry and weakness.  Psychiatric/Behavioral: Negative for confusion.  Rest of ROS, see pertinent positives sand negatives in HPI  Current Outpatient Medications on File Prior to Visit  Medication Sig Dispense Refill  . amLODipine (NORVASC) 5 MG tablet Take 1 tablet (5 mg total) by mouth daily. 90 tablet 1  . aspirin 81 MG tablet Take 81 mg by mouth daily.      Marland Kitchen atenolol-chlorthalidone (TENORETIC) 50-25 MG tablet Take 1 tablet by mouth daily. 90 tablet 1  . fluticasone (FLONASE) 50 MCG/ACT nasal spray Place 2 sprays into both nostrils daily. 16 g 6  . meloxicam (MOBIC) 15 MG tablet Take 1 tablet (15 mg total) by mouth daily as needed for pain. 30 tablet 0  No current facility-administered medications on file prior to visit.   Past Medical History:  Diagnosis Date  . Arthritis   . Hypertension    No Known Allergies  Social History   Socioeconomic History  . Marital status: Married    Spouse name: Not on file  . Number of children: Not on file  . Years of education: Not on file  . Highest education level: Not on file  Occupational History  . Not on file  Tobacco Use  . Smoking status: Current Every Day Smoker    Years: 35.00    Types: Cigarettes  . Smokeless tobacco: Never Used  . Tobacco comment: 10 cigarettes per day  Substance and Sexual Activity  . Alcohol use: Yes    Alcohol/week: 6.0 standard drinks    Types: 6 Cans of beer per week    Comment: q 2 weeks  . Drug use: No  . Sexual activity: Yes  Other  Topics Concern  . Not on file  Social History Narrative  . Not on file   Social Determinants of Health   Financial Resource Strain:   . Difficulty of Paying Living Expenses: Not on file  Food Insecurity:   . Worried About Charity fundraiser in the Last Year: Not on file  . Ran Out of Food in the Last Year: Not on file  Transportation Needs:   . Lack of Transportation (Medical): Not on file  . Lack of Transportation (Non-Medical): Not on file  Physical Activity:   . Days of Exercise per Week: Not on file  . Minutes of Exercise per Session: Not on file  Stress:   . Feeling of Stress : Not on file  Social Connections:   . Frequency of Communication with Friends and Family: Not on file  . Frequency of Social Gatherings with Friends and Family: Not on file  . Attends Religious Services: Not on file  . Active Member of Clubs or Organizations: Not on file  . Attends Archivist Meetings: Not on file  . Marital Status: Not on file   Vitals:   02/13/20 1524  BP: 124/80  Pulse: 82  Resp: 16  SpO2: 98%   Body mass index is 27.93 kg/m.  Physical Exam Vitals and nursing note reviewed.  Constitutional:      General: He is not in acute distress.    Appearance: He is well-developed.  HENT:     Head: Normocephalic and atraumatic.     Mouth/Throat:     Mouth: Mucous membranes are moist.     Pharynx: Oropharynx is clear.  Eyes:     Conjunctiva/sclera: Conjunctivae normal.     Pupils: Pupils are equal, round, and reactive to light.  Cardiovascular:     Rate and Rhythm: Normal rate and regular rhythm.     Pulses:          Dorsalis pedis pulses are 2+ on the right side and 2+ on the left side.     Heart sounds: No murmur heard.   Pulmonary:     Effort: Pulmonary effort is normal. No respiratory distress.     Breath sounds: Normal breath sounds.  Abdominal:     Palpations: Abdomen is soft. There is no hepatomegaly or mass.     Tenderness: There is no abdominal  tenderness.  Musculoskeletal:     Lumbar back: No tenderness or bony tenderness. Negative right straight leg raise test and negative left straight leg raise test.  Comments: Mild limitation of shoulder ROM. Pain elicited with movement. Left shoulder: No deformity, edema, or erythema appreciated. Jeffrey Wagner' test positive, empty can supraspinatus test  pos, lift-Off Subscapularis test positive. Maneuvers negative for right shoulder.   Lymphadenopathy:     Cervical: No cervical adenopathy.  Skin:    General: Skin is warm.     Findings: No erythema or rash.  Neurological:     Mental Status: He is alert and oriented to person, place, and time.     Cranial Nerves: No cranial nerve deficit.     Gait: Gait normal.  Psychiatric:     Comments: Well groomed, good eye contact.    ASSESSMENT AND PLAN:  [name] was seen today for follow-up.  Orders Placed This Encounter  Procedures  . Flu Vaccine QUAD 36+ mos IM  . Varicella-zoster vaccine IM  . Lipid panel  . Ambulatory referral to Orthopedic Surgery  . Ambulatory Referral for Lung Cancer Scre   Lab Results  Component Value Date   CHOL 238 (H) 02/13/2020   HDL 43 02/13/2020   LDLCALC 158 (H) 02/13/2020   LDLDIRECT 143.3 11/24/2010   TRIG 206 (H) 02/13/2020   CHOLHDL 5.5 (H) 02/13/2020   Generalized osteoarthritis of multiple sites Educated about Dx,prognosis,and treatment options. Cymbalta side effects discussed. Continue Tylenol 500 mg q 4-6 hours.  -     DULoxetine (CYMBALTA) 30 MG capsule; Take 1 capsule (30 mg total) by mouth daily.  Hyperlipidemia, unspecified hyperlipidemia type Recommendations will be given according to lipid panel results. Low fat diet.  Essential hypertension BP adequately controlled. Continue Atenolol-HCTZ 50-25 mg daily and Amlodipine 5 mg daily. Low salt diet.  Chronic left-sided low back pain without sciatica Ortho referral placed. Continue Tylenol up to 5-6 tabs daily (3  g).  Chronic pain of both shoulders Getting worse. He would like ortho evaluation, referral placed. ROM exercises.  Tobacco use disorder Adverse effects of tobacco use and benefits of smoking cessation discussed. Meets criteria for lung cancer screening.  Atherosclerosis of aorta ([place]) He is not on statin. Strongly recommended smoking cessation. He is on Aspirin 81 mg daily.  Need for influenza vaccination -     Flu Vaccine QUAD 36+ mos IM  Need for shingles vaccine -     Varicella-zoster vaccine IM   Return in about 2 months (around 04/14/2020).   Nyrie Sigal G. [place], MD  [facility]. [place] office.  A few things to remember from today's visit:   Hyperlipidemia, unspecified hyperlipidemia type - Plan: Lipid panel  Essential hypertension  Chronic left-sided low back pain without sciatica  Chronic pain of both shoulders - Plan: Ambulatory referral to Orthopedic Surgery  Tobacco use disorder - Plan: Ambulatory Referral for Lung Cancer Scre  Generalized osteoarthritis of multiple sites - Plan: DULoxetine (CYMBALTA) 30 MG capsule  Today Cymbalta 30 mg added to help with joint pain. You can take up to 2.5-3 g of tylenol daily, tylenol 500 mg up to 5 per day.  If you need refills please call your pharmacy. Do not use My Chart to request refills or for acute issues that need immediate attention.    Please be sure medication list is accurate. If a new problem present, please set up appointment sooner than planned today.

## 2020-02-13 NOTE — Patient Instructions (Signed)
A few things to remember from today's visit:   Hyperlipidemia, unspecified hyperlipidemia type - Plan: Lipid panel  Essential hypertension  Chronic left-sided low back pain without sciatica  Chronic pain of both shoulders - Plan: Ambulatory referral to Orthopedic Surgery  Tobacco use disorder - Plan: Ambulatory Referral for Lung Cancer Scre  Generalized osteoarthritis of multiple sites - Plan: DULoxetine (CYMBALTA) 30 MG capsule  Today Cymbalta 30 mg added to help with joint pain. You can take up to 2.5-3 g of tylenol daily, tylenol 500 mg up to 5 per day.  If you need refills please call your pharmacy. Do not use My Chart to request refills or for acute issues that need immediate attention.    Please be sure medication list is accurate. If a new problem present, please set up appointment sooner than planned today.

## 2020-02-14 LAB — LIPID PANEL
Cholesterol: 238 mg/dL — ABNORMAL HIGH (ref <200)
HDL: 43 mg/dL (ref >=40)
LDL Cholesterol (Calc): 158 mg/dL (calc) — ABNORMAL HIGH
Non-HDL Cholesterol (Calc): 195 mg/dL (calc) — ABNORMAL HIGH (ref <130)
Total CHOL/HDL Ratio: 5.5 (calc) — ABNORMAL HIGH (ref <5.0)
Triglycerides: 206 mg/dL — ABNORMAL HIGH (ref <150)

## 2020-02-17 MED ORDER — ATORVASTATIN CALCIUM 20 MG PO TABS: 20.0000 mg | ORAL_TABLET | Freq: Every day | ORAL | 3 refills | Status: DC

## 2020-02-20 ENCOUNTER — Ambulatory Visit: Payer: Self-pay

## 2020-02-20 ENCOUNTER — Ambulatory Visit (INDEPENDENT_AMBULATORY_CARE_PROVIDER_SITE_OTHER): Payer: BC Managed Care – PPO | Admitting: Orthopaedic Surgery

## 2020-02-20 DIAGNOSIS — M25511 Pain in right shoulder: Secondary | ICD-10-CM

## 2020-02-20 DIAGNOSIS — G8929 Other chronic pain: Secondary | ICD-10-CM

## 2020-02-20 DIAGNOSIS — M25512 Pain in left shoulder: Secondary | ICD-10-CM | POA: Diagnosis not present

## 2020-02-20 NOTE — Addendum Note (Signed)
Addended by: Robyne Peers on: 02/20/2020 04:40 PM   Modules accepted: Orders

## 2020-02-20 NOTE — Progress Notes (Signed)
Office Visit Note   Patient: Jeffrey Wagner           Date of Birth: 1955/07/07           MRN: [id number] Visit Date: 02/20/2020              Requested by: Martinique, Betty G, MD [address] [place],   [id number] PCP: Martinique, Betty G, MD   Assessment & Plan: Visit Diagnoses:  1. Chronic pain of both shoulders     Plan: Based on the x-rays I think that he is having subacromial impingement.  He has large downsloping anterior acromial osteophytes that predisposed him to impingement of the rotator cuff.  Given failure of conservative treatments I have recommended MRI of both shoulders to evaluate for structural abnormalities as well as surgical planning.  Patient is not interested in cortisone injections and I also do not believe that these will help significantly since that this is a structural problem.  Follow-up after the MRI.  Follow-Up Instructions: Return if symptoms worsen or fail to improve.   Orders:  Orders Placed This Encounter  Procedures  . XR Shoulder Left  . XR Shoulder Right   No orders of the defined types were placed in this encounter.     Procedures: No procedures performed   Clinical Data: No additional findings.   Subjective: Chief Complaint  Patient presents with  . Right Shoulder - Pain  . Left Shoulder - Pain    Jeffrey Wagner is a very pleasant 64 year old gentleman comes in for evaluation of bilateral shoulder pain for several years.  He has been to a chiropractor and his PCP and has been taking Tylenol at night as well as undergone multiple chiropractic treatments.  He states he has no pain at rest mainly with lifting and activity.  He has performed his job for the last 20 years that requires a lot of lifting.  He has trouble sleeping at night.  Denies any injuries.  The right shoulder is worse.  Denies any numbness or tingling or radicular symptoms.   Review of Systems  Constitutional: Negative.   All other systems reviewed and are  negative.    Objective: Vital Signs: There were no vitals taken for this visit.  Physical Exam Vitals and nursing note reviewed.  Constitutional:      Appearance: He is well-developed.  HENT:     Head: Normocephalic and atraumatic.  Eyes:     Pupils: Pupils are equal, round, and reactive to light.  Pulmonary:     Effort: Pulmonary effort is normal.  Abdominal:     Palpations: Abdomen is soft.  Musculoskeletal:        General: Normal range of motion.     Cervical back: Neck supple.  Skin:    General: Skin is warm.  Neurological:     Mental Status: He is alert and oriented to person, place, and time.  Psychiatric:        Behavior: Behavior normal.        Thought Content: Thought content normal.        Judgment: Judgment normal.     Ortho Exam Bilateral shoulder show mild restriction and forward flexion and internal rotation.  External rotation is well-preserved.  Manual muscle testing is normal.  Positive Hawkins sign.  Cervical spine is nontender with good range of motion. Specialty Comments:  No specialty comments available.  Imaging: XR Shoulder Left  Result Date: 02/20/2020 Mild glenohumeral osteoarthritis.  Large anterior and downward  sloping acromion osteophyte.  AC joint arthrosis.  XR Shoulder Right  Result Date: 02/20/2020 Mild glenohumeral osteoarthritis.  AC joint arthrosis.  Large anterior downward sloping acromion osteophyte    PMFS History: Patient Active Problem List   Diagnosis Date Noted  . Atherosclerosis of aorta ([place]) 02/13/2020  . Right shoulder pain 07/27/2017  . Hyperlipidemia 07/26/2017  . CNTC DERMATITIS&OTH ECZEMA DUE OTH CHEM PRODUCTS 11/05/2009  . ERECTILE DYSFUNCTION, MILD 06/23/2007  . TOBACCO ABUSE 02/15/2007  . Essential hypertension 12/27/2006  . OSTEOARTHRITIS 12/27/2006  . LOW BACK PAIN 12/27/2006   Past Medical History:  Diagnosis Date  . Arthritis   . Hypertension     No family history on file.  No past surgical  history on file. Social History   Occupational History  . Not on file  Tobacco Use  . Smoking status: Current Every Day Smoker    Years: 35.00    Types: Cigarettes  . Smokeless tobacco: Never Used  . Tobacco comment: 10 cigarettes per day  Substance and Sexual Activity  . Alcohol use: Yes    Alcohol/week: 6.0 standard drinks    Types: 6 Cans of beer per week    Comment: q 2 weeks  . Drug use: No  . Sexual activity: Yes

## 2020-02-21 ENCOUNTER — Ambulatory Visit: Payer: BC Managed Care – PPO | Admitting: Orthopaedic Surgery

## 2020-02-27 ENCOUNTER — Ambulatory Visit: Payer: BC Managed Care – PPO | Attending: Internal Medicine

## 2020-02-27 DIAGNOSIS — Z23 Encounter for immunization: Secondary | ICD-10-CM

## 2020-02-27 NOTE — Progress Notes (Signed)
   Covid-19 Vaccination Clinic  Name:  Lumir Demetriou    MRN: [id number] DOB: June 03, 1955  02/27/2020  Mr. Bagot was observed post Covid-19 immunization for 15 minutes without incident. He was provided with Vaccine Information Sheet and instruction to access the V-Safe system.   Mr. Litsey was instructed to call 911 with any severe reactions post vaccine: Marland Kitchen Difficulty breathing  . Swelling of face and throat  . A fast heartbeat  . A bad rash all over body  . Dizziness and weakness

## 2020-03-17 ENCOUNTER — Other Ambulatory Visit: Payer: BC Managed Care – PPO

## 2020-06-04 ENCOUNTER — Encounter: Payer: Self-pay | Admitting: Internal Medicine

## 2020-06-24 ENCOUNTER — Other Ambulatory Visit: Payer: Self-pay

## 2020-06-24 ENCOUNTER — Ambulatory Visit (AMBULATORY_SURGERY_CENTER): Payer: Self-pay | Admitting: *Deleted

## 2020-06-24 VITALS — Ht 69.0 in | Wt 191.0 lb

## 2020-06-24 DIAGNOSIS — Z1211 Encounter for screening for malignant neoplasm of colon: Secondary | ICD-10-CM

## 2020-06-24 NOTE — Progress Notes (Signed)

## 2020-07-02 DIAGNOSIS — Z86010 Personal history of colonic polyps: Secondary | ICD-10-CM

## 2020-07-02 DIAGNOSIS — Z860101 Personal history of adenomatous and serrated colon polyps: Secondary | ICD-10-CM | POA: Insufficient documentation

## 2020-07-02 HISTORY — DX: Personal history of adenomatous and serrated colon polyps: Z86.0101

## 2020-07-02 HISTORY — DX: Personal history of colonic polyps: Z86.010

## 2020-07-09 ENCOUNTER — Encounter: Payer: Self-pay | Admitting: Internal Medicine

## 2020-07-16 ENCOUNTER — Other Ambulatory Visit: Payer: Self-pay | Admitting: Family Medicine

## 2020-07-16 DIAGNOSIS — I10 Essential (primary) hypertension: Secondary | ICD-10-CM

## 2020-07-22 ENCOUNTER — Other Ambulatory Visit: Payer: Self-pay

## 2020-07-22 ENCOUNTER — Ambulatory Visit (AMBULATORY_SURGERY_CENTER): Payer: BC Managed Care – PPO | Admitting: Internal Medicine

## 2020-07-22 ENCOUNTER — Encounter: Payer: Self-pay | Admitting: Internal Medicine

## 2020-07-22 VITALS — BP 105/80 | HR 83 | Temp 97.8°F | Resp 28 | Ht 69.0 in | Wt 191.0 lb

## 2020-07-22 DIAGNOSIS — D124 Benign neoplasm of descending colon: Secondary | ICD-10-CM | POA: Diagnosis not present

## 2020-07-22 DIAGNOSIS — Z1211 Encounter for screening for malignant neoplasm of colon: Secondary | ICD-10-CM

## 2020-07-22 DIAGNOSIS — D125 Benign neoplasm of sigmoid colon: Secondary | ICD-10-CM

## 2020-07-22 DIAGNOSIS — D120 Benign neoplasm of cecum: Secondary | ICD-10-CM | POA: Diagnosis not present

## 2020-07-22 MED ORDER — SODIUM CHLORIDE 0.9 % IV SOLN: 500.0000 mL | Freq: Once | INTRAVENOUS | Status: DC

## 2020-07-22 NOTE — Progress Notes (Signed)
Pt's states no medical or surgical changes since previsit or office visit. 

## 2020-07-22 NOTE — Progress Notes (Signed)
C.W. vital signs. 

## 2020-07-22 NOTE — Patient Instructions (Addendum)
I found and removed 5 small polyps that look benign.  I will let you know pathology results and when to have another routine colonoscopy by mail and/or My Chart.  I appreciate the opportunity to care for you. Gatha Mayer, MD, [facility]  Read all handouts given to you by your recovery room nurse.  YOU HAD AN ENDOSCOPIC PROCEDURE TODAY AT [facility]:   Refer to the procedure report that was given to you for any specific questions about what was found during the examination.  If the procedure report does not answer your questions, please call your gastroenterologist to clarify.  If you requested that your care partner not be given the details of your procedure findings, then the procedure report has been included in a sealed envelope for you to review at your convenience later.  YOU SHOULD EXPECT: Some feelings of bloating in the abdomen. Passage of more gas than usual.  Walking can help get rid of the air that was put into your GI tract during the procedure and reduce the bloating. If you had a lower endoscopy (such as a colonoscopy or flexible sigmoidoscopy) you may notice spotting of blood in your stool or on the toilet paper. If you underwent a bowel prep for your procedure, you may not have a normal bowel movement for a few days.  Please Note:  You might notice some irritation and congestion in your nose or some drainage.  This is from the oxygen used during your procedure.  There is no need for concern and it should clear up in a day or so.  SYMPTOMS TO REPORT IMMEDIATELY:   Following lower endoscopy (colonoscopy or flexible sigmoidoscopy):  Excessive amounts of blood in the stool  Significant tenderness or worsening of abdominal pains  Swelling of the abdomen that is new, acute  Fever of 100F or higher   For urgent or emergent issues, a gastroenterologist can be reached at any hour by calling [phone]. Do not use MyChart messaging for urgent concerns.    DIET:   We do recommend a small meal at first, but then you may proceed to your regular diet.  Drink plenty of fluids but you should avoid alcoholic beverages for 24 hours.  ACTIVITY:  You should plan to take it easy for the rest of today and you should NOT DRIVE or use heavy machinery until tomorrow (because of the sedation medicines used during the test).    FOLLOW UP: Our staff will call the number listed on your records 48-72 hours following your procedure to check on you and address any questions or concerns that you may have regarding the information given to you following your procedure. If we do not reach you, we will leave a message.  We will attempt to reach you two times.  During this call, we will ask if you have developed any symptoms of COVID 19. If you develop any symptoms (ie: fever, flu-like symptoms, shortness of breath, cough etc.) before then, please call [phone].  If you test positive for Covid 19 in the 2 weeks post procedure, please call and report this information to [place].    If any biopsies were taken you will be contacted by phone or by letter within the next 1-3 weeks.  Please call us at [phone] if you have not heard about the biopsies in 3 weeks.    SIGNATURES/CONFIDENTIALITY: You and/or your care partner have signed paperwork which will be entered into your electronic medical record.  These signatures attest to the fact that that the information above on your After Visit Summary has been reviewed and is understood.  Full responsibility of the confidentiality of this discharge information lies with you and/or your care-partner.

## 2020-07-22 NOTE — Progress Notes (Signed)
PT taken to PACU. Monitors in place. VSS. Report given to RN. 

## 2020-07-22 NOTE — Progress Notes (Signed)
Called to room to assist during endoscopic procedure.  Patient ID and intended procedure confirmed with present staff. Received instructions for my participation in the procedure from the performing physician.  

## 2020-07-22 NOTE — Op Note (Signed)
[facility] Patient Name: Jeffrey Wagner Procedure Date: 07/22/2020 4:32 PM MRN: [id number] Endoscopist: Gatha Mayer , MD Age: 65 Referring MD:  Date of Birth: 05/31/1955 Gender: Male Account #: [id number] Procedure:                Colonoscopy Indications:              Screening for colorectal malignant neoplasm, Last                            colonoscopy: 2008 Medicines:                Propofol per Anesthesia, Monitored Anesthesia Care Procedure:                Pre-Anesthesia Assessment:                           - Prior to the procedure, a History and Physical                            was performed, and patient medications and                            allergies were reviewed. The patient's tolerance of                            previous anesthesia was also reviewed. The risks                            and benefits of the procedure and the sedation                            options and risks were discussed with the patient.                            All questions were answered, and informed consent                            was obtained. Prior Anticoagulants: The patient has                            taken no previous anticoagulant or antiplatelet                            agents. ASA Grade Assessment: II - A patient with                            mild systemic disease. After reviewing the risks                            and benefits, the patient was deemed in                            satisfactory condition to undergo the procedure.  After obtaining informed consent, the colonoscope                            was passed under direct vision. Throughout the                            procedure, the patient's blood pressure, pulse, and                            oxygen saturations were monitored continuously. The                            Olympus [id number]) [id number] was introduced                            through the anus and  advanced to the the cecum,                            identified by appendiceal orifice and ileocecal                            valve. The colonoscopy was performed without                            difficulty. The patient tolerated the procedure                            well. The quality of the bowel preparation was                            good. The ileocecal valve, appendiceal orifice, and                            rectum were photographed. The bowel preparation                            used was Miralax via split dose instruction. Scope In: 4:39:19 PM Scope Out: 4:55:07 PM Scope Withdrawal Time: 0 hours 13 minutes 39 seconds  Total Procedure Duration: 0 hours 15 minutes 48 seconds  Findings:                 The perianal and digital rectal examinations were                            normal. Pertinent negatives include normal prostate                            (size, shape, and consistency).                           Four sessile polyps were found in the sigmoid colon                            and descending colon. The polyps were diminutive in  size. These polyps were removed with a cold snare.                            Resection and retrieval were complete. Verification                            of patient identification for the specimen was                            done. Estimated blood loss was minimal.                           A 1 mm polyp was found in the cecum. The polyp was                            sessile. The polyp was removed with a cold biopsy                            forceps. Resection and retrieval were complete.                            Verification of patient identification for the                            specimen was done. Estimated blood loss was minimal.                           The exam was otherwise without abnormality on                            direct and retroflexion views. Complications:            No  immediate complications. Estimated Blood Loss:     Estimated blood loss was minimal. Impression:               - Four diminutive polyps in the sigmoid colon and                            in the descending colon, removed with a cold snare.                            Resected and retrieved.                           - One 1 mm polyp in the cecum, removed with a cold                            biopsy forceps. Resected and retrieved.                           - The examination was otherwise normal on direct                            and retroflexion views.  Recommendation:           - Patient has a contact number available for                            emergencies. The signs and symptoms of potential                            delayed complications were discussed with the                            patient. Return to normal activities tomorrow.                            Written discharge instructions were provided to the                            patient.                           - Resume previous diet.                           - Continue present medications.                           - Repeat colonoscopy is recommended. The                            colonoscopy date will be determined after pathology                            results from today's exam become available for                            review. Gatha Mayer, MD 07/22/2020 5:07:30 PM This report has been signed electronically.

## 2020-07-24 ENCOUNTER — Telehealth: Payer: Self-pay

## 2020-07-24 NOTE — Telephone Encounter (Signed)
LVM

## 2020-07-30 ENCOUNTER — Encounter: Payer: Self-pay | Admitting: Internal Medicine

## 2020-08-25 ENCOUNTER — Other Ambulatory Visit: Payer: Self-pay | Admitting: Family Medicine

## 2020-08-25 DIAGNOSIS — I10 Essential (primary) hypertension: Secondary | ICD-10-CM

## 2020-10-27 IMAGING — DX LUMBAR SPINE - COMPLETE 4+ VIEW
5 series · 5 of 5 positions shown · non-contrast
Comparison: No comparison lumbar spine exam. Comparison thoracic
spine imaging 10/19/2017.

CLINICAL DATA: 62-year-old male with lower back pain. No injury.
Initial encounter.

EXAM:
LUMBAR SPINE - COMPLETE 4+ VIEW

[lumbar spine ap]
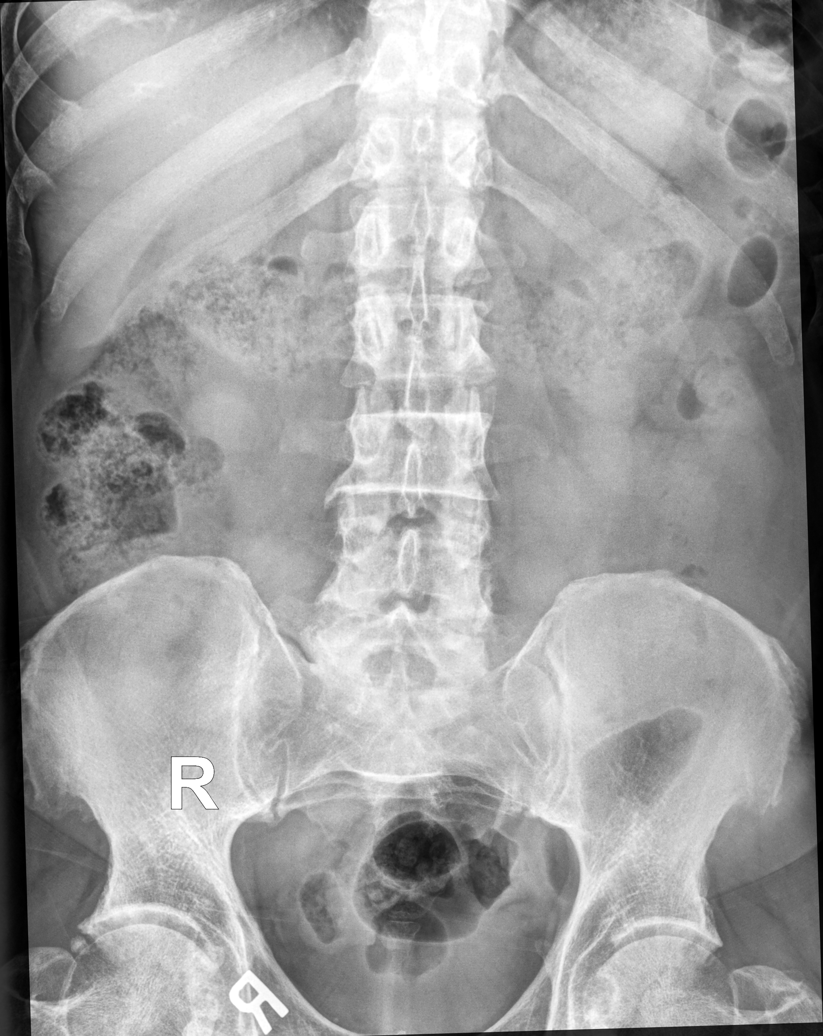

[lumbar spine oblique (1 of 2)]
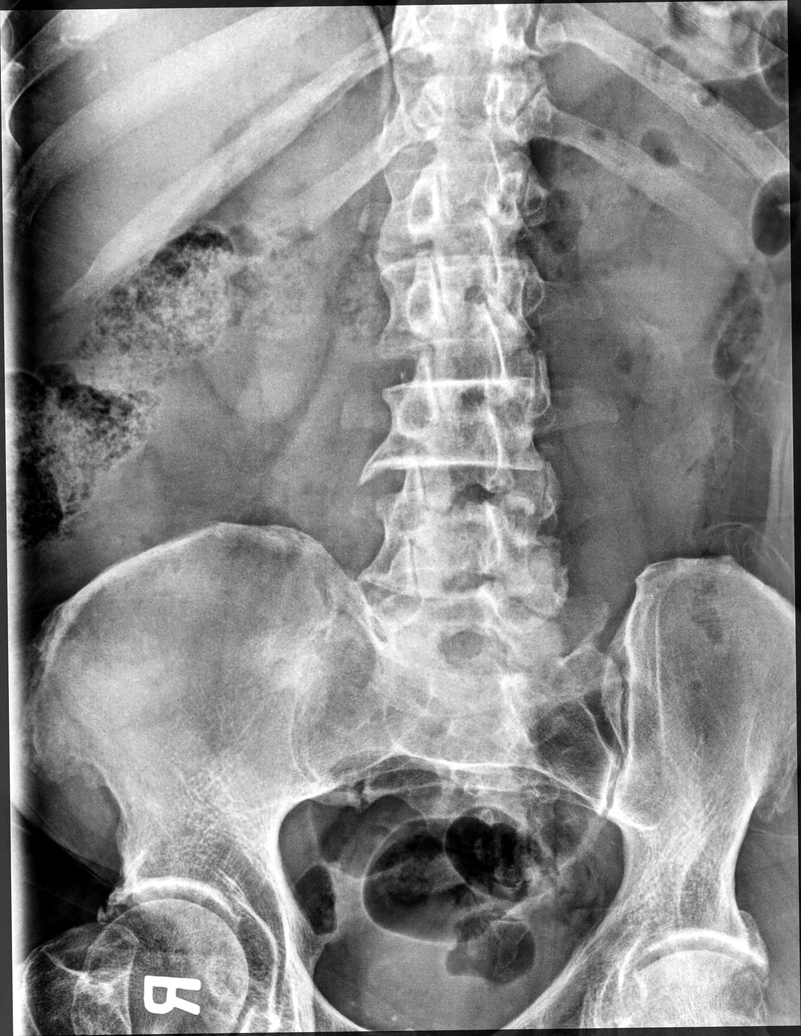

[lumbar spine oblique (2 of 2)]
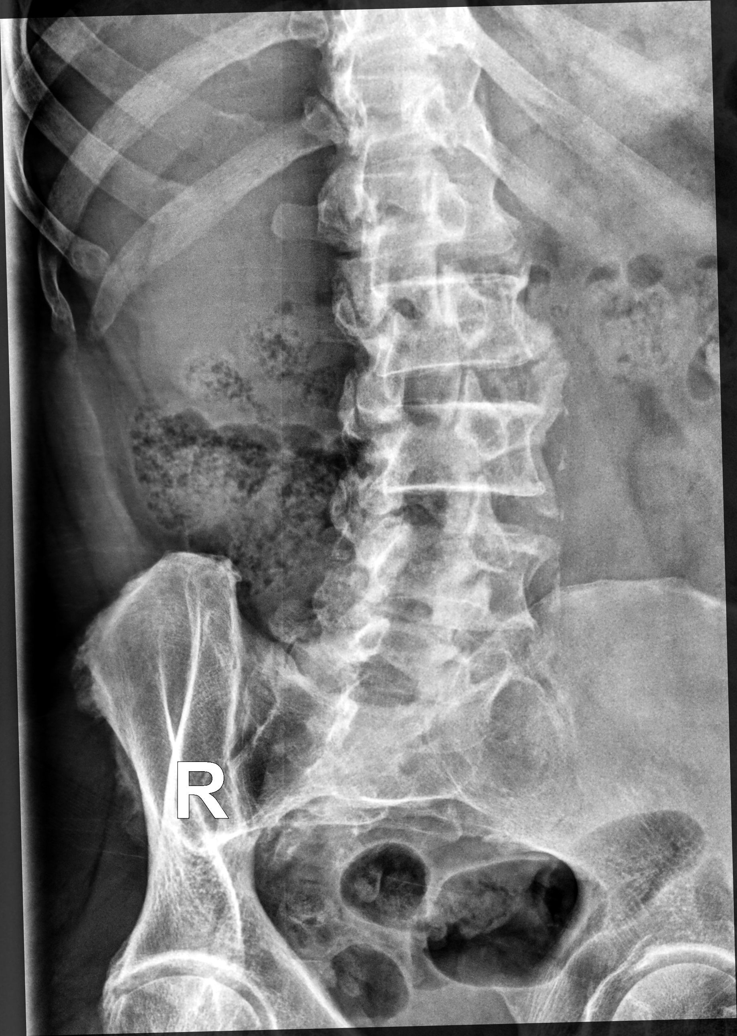

[lumbar spine lat (1 of 2)]
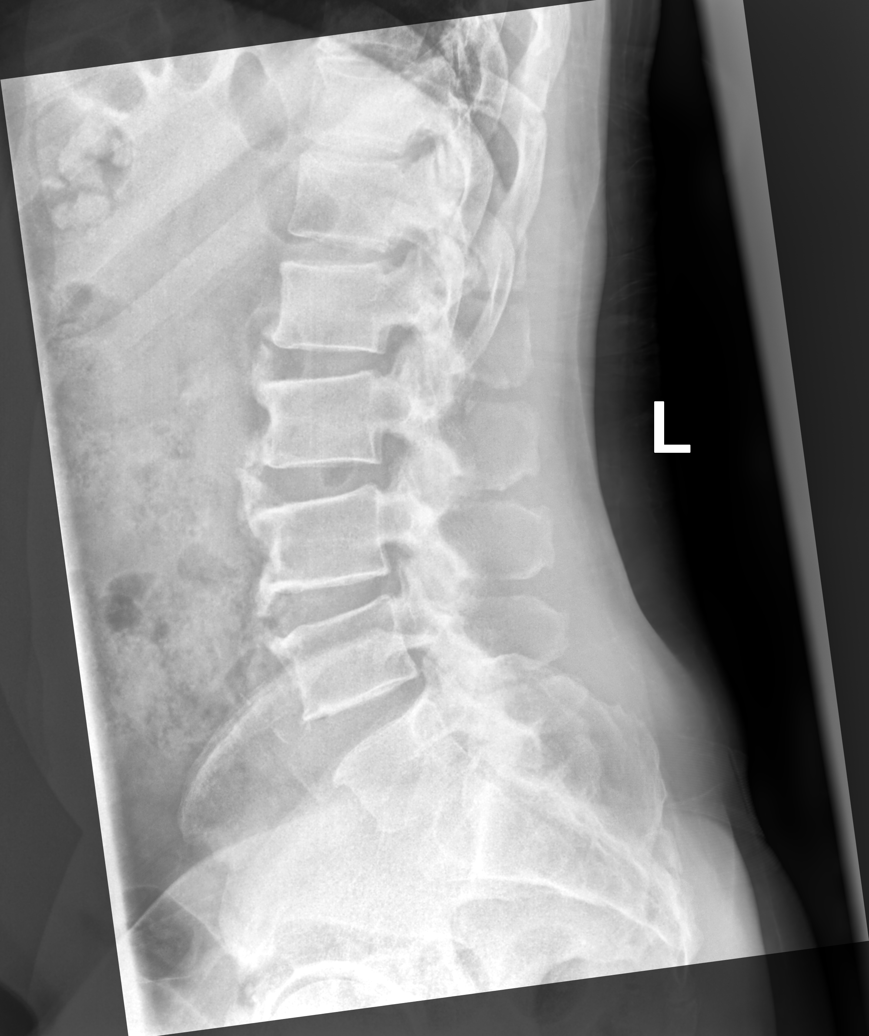

[lumbar spine lat (2 of 2)]
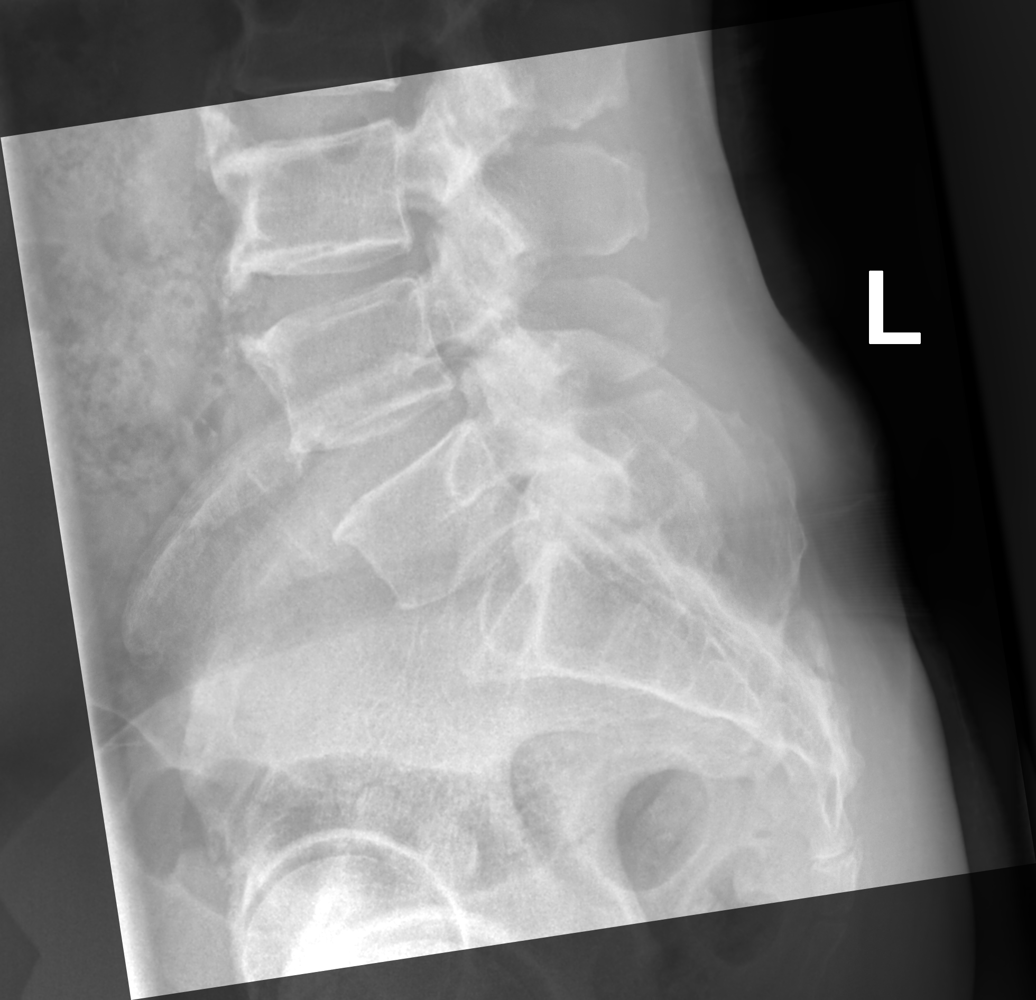

[5 of 5 positions shown; findings below may reference images not displayed]

FINDINGS: No compression fracture, pars defect or malalignment. No significant
lumbar disc space narrowing. Scattered osteophytes. Mild L4-5 facet
degenerative changes.

Vascular calcifications.

Sacroiliac joints appear intact.
IMPRESSION: 1. No significant lumbar disc space narrowing. Scattered
osteophytes.
2. Mild L4-5 facet degenerative changes.
3.  Aortic Atherosclerosis (110IU-YTS.S).

## 2020-11-15 DIAGNOSIS — M9902 Segmental and somatic dysfunction of thoracic region: Secondary | ICD-10-CM | POA: Diagnosis not present

## 2020-11-15 DIAGNOSIS — M5386 Other specified dorsopathies, lumbar region: Secondary | ICD-10-CM | POA: Diagnosis not present

## 2020-11-15 DIAGNOSIS — M9904 Segmental and somatic dysfunction of sacral region: Secondary | ICD-10-CM | POA: Diagnosis not present

## 2020-11-15 DIAGNOSIS — M9903 Segmental and somatic dysfunction of lumbar region: Secondary | ICD-10-CM | POA: Diagnosis not present

## 2020-11-19 ENCOUNTER — Other Ambulatory Visit: Payer: Self-pay | Admitting: Family Medicine

## 2020-11-19 DIAGNOSIS — M9903 Segmental and somatic dysfunction of lumbar region: Secondary | ICD-10-CM | POA: Diagnosis not present

## 2020-11-19 DIAGNOSIS — M9902 Segmental and somatic dysfunction of thoracic region: Secondary | ICD-10-CM | POA: Diagnosis not present

## 2020-11-19 DIAGNOSIS — M9904 Segmental and somatic dysfunction of sacral region: Secondary | ICD-10-CM | POA: Diagnosis not present

## 2020-11-19 DIAGNOSIS — M5386 Other specified dorsopathies, lumbar region: Secondary | ICD-10-CM | POA: Diagnosis not present

## 2020-11-21 DIAGNOSIS — M9902 Segmental and somatic dysfunction of thoracic region: Secondary | ICD-10-CM | POA: Diagnosis not present

## 2020-11-21 DIAGNOSIS — M9904 Segmental and somatic dysfunction of sacral region: Secondary | ICD-10-CM | POA: Diagnosis not present

## 2020-11-21 DIAGNOSIS — M9903 Segmental and somatic dysfunction of lumbar region: Secondary | ICD-10-CM | POA: Diagnosis not present

## 2020-11-21 DIAGNOSIS — M5386 Other specified dorsopathies, lumbar region: Secondary | ICD-10-CM | POA: Diagnosis not present

## 2020-11-26 DIAGNOSIS — M9904 Segmental and somatic dysfunction of sacral region: Secondary | ICD-10-CM | POA: Diagnosis not present

## 2020-11-26 DIAGNOSIS — M9902 Segmental and somatic dysfunction of thoracic region: Secondary | ICD-10-CM | POA: Diagnosis not present

## 2020-11-26 DIAGNOSIS — M5386 Other specified dorsopathies, lumbar region: Secondary | ICD-10-CM | POA: Diagnosis not present

## 2020-11-26 DIAGNOSIS — M9903 Segmental and somatic dysfunction of lumbar region: Secondary | ICD-10-CM | POA: Diagnosis not present

## 2020-11-28 DIAGNOSIS — M9904 Segmental and somatic dysfunction of sacral region: Secondary | ICD-10-CM | POA: Diagnosis not present

## 2020-11-28 DIAGNOSIS — M9902 Segmental and somatic dysfunction of thoracic region: Secondary | ICD-10-CM | POA: Diagnosis not present

## 2020-11-28 DIAGNOSIS — M9903 Segmental and somatic dysfunction of lumbar region: Secondary | ICD-10-CM | POA: Diagnosis not present

## 2020-11-28 DIAGNOSIS — M5386 Other specified dorsopathies, lumbar region: Secondary | ICD-10-CM | POA: Diagnosis not present

## 2020-12-03 DIAGNOSIS — M9904 Segmental and somatic dysfunction of sacral region: Secondary | ICD-10-CM | POA: Diagnosis not present

## 2020-12-03 DIAGNOSIS — M9902 Segmental and somatic dysfunction of thoracic region: Secondary | ICD-10-CM | POA: Diagnosis not present

## 2020-12-03 DIAGNOSIS — M9903 Segmental and somatic dysfunction of lumbar region: Secondary | ICD-10-CM | POA: Diagnosis not present

## 2020-12-03 DIAGNOSIS — M5386 Other specified dorsopathies, lumbar region: Secondary | ICD-10-CM | POA: Diagnosis not present

## 2020-12-05 DIAGNOSIS — M5386 Other specified dorsopathies, lumbar region: Secondary | ICD-10-CM | POA: Diagnosis not present

## 2020-12-05 DIAGNOSIS — M9902 Segmental and somatic dysfunction of thoracic region: Secondary | ICD-10-CM | POA: Diagnosis not present

## 2020-12-05 DIAGNOSIS — M9903 Segmental and somatic dysfunction of lumbar region: Secondary | ICD-10-CM | POA: Diagnosis not present

## 2020-12-05 DIAGNOSIS — M9904 Segmental and somatic dysfunction of sacral region: Secondary | ICD-10-CM | POA: Diagnosis not present

## 2020-12-10 DIAGNOSIS — M5386 Other specified dorsopathies, lumbar region: Secondary | ICD-10-CM | POA: Diagnosis not present

## 2020-12-10 DIAGNOSIS — M9903 Segmental and somatic dysfunction of lumbar region: Secondary | ICD-10-CM | POA: Diagnosis not present

## 2020-12-10 DIAGNOSIS — M9904 Segmental and somatic dysfunction of sacral region: Secondary | ICD-10-CM | POA: Diagnosis not present

## 2020-12-10 DIAGNOSIS — M9902 Segmental and somatic dysfunction of thoracic region: Secondary | ICD-10-CM | POA: Diagnosis not present

## 2020-12-12 DIAGNOSIS — M5386 Other specified dorsopathies, lumbar region: Secondary | ICD-10-CM | POA: Diagnosis not present

## 2020-12-12 DIAGNOSIS — M9902 Segmental and somatic dysfunction of thoracic region: Secondary | ICD-10-CM | POA: Diagnosis not present

## 2020-12-12 DIAGNOSIS — M9903 Segmental and somatic dysfunction of lumbar region: Secondary | ICD-10-CM | POA: Diagnosis not present

## 2020-12-12 DIAGNOSIS — M9904 Segmental and somatic dysfunction of sacral region: Secondary | ICD-10-CM | POA: Diagnosis not present

## 2020-12-17 DIAGNOSIS — M5386 Other specified dorsopathies, lumbar region: Secondary | ICD-10-CM | POA: Diagnosis not present

## 2020-12-17 DIAGNOSIS — M9904 Segmental and somatic dysfunction of sacral region: Secondary | ICD-10-CM | POA: Diagnosis not present

## 2020-12-17 DIAGNOSIS — M9902 Segmental and somatic dysfunction of thoracic region: Secondary | ICD-10-CM | POA: Diagnosis not present

## 2020-12-17 DIAGNOSIS — M9903 Segmental and somatic dysfunction of lumbar region: Secondary | ICD-10-CM | POA: Diagnosis not present

## 2020-12-19 DIAGNOSIS — M5386 Other specified dorsopathies, lumbar region: Secondary | ICD-10-CM | POA: Diagnosis not present

## 2020-12-19 DIAGNOSIS — M9902 Segmental and somatic dysfunction of thoracic region: Secondary | ICD-10-CM | POA: Diagnosis not present

## 2020-12-19 DIAGNOSIS — M9904 Segmental and somatic dysfunction of sacral region: Secondary | ICD-10-CM | POA: Diagnosis not present

## 2020-12-19 DIAGNOSIS — M9903 Segmental and somatic dysfunction of lumbar region: Secondary | ICD-10-CM | POA: Diagnosis not present

## 2020-12-25 DIAGNOSIS — M9903 Segmental and somatic dysfunction of lumbar region: Secondary | ICD-10-CM | POA: Diagnosis not present

## 2020-12-25 DIAGNOSIS — M5386 Other specified dorsopathies, lumbar region: Secondary | ICD-10-CM | POA: Diagnosis not present

## 2020-12-25 DIAGNOSIS — M9904 Segmental and somatic dysfunction of sacral region: Secondary | ICD-10-CM | POA: Diagnosis not present

## 2020-12-25 DIAGNOSIS — M9902 Segmental and somatic dysfunction of thoracic region: Secondary | ICD-10-CM | POA: Diagnosis not present

## 2021-01-01 DIAGNOSIS — M9904 Segmental and somatic dysfunction of sacral region: Secondary | ICD-10-CM | POA: Diagnosis not present

## 2021-01-01 DIAGNOSIS — M9903 Segmental and somatic dysfunction of lumbar region: Secondary | ICD-10-CM | POA: Diagnosis not present

## 2021-01-01 DIAGNOSIS — M9902 Segmental and somatic dysfunction of thoracic region: Secondary | ICD-10-CM | POA: Diagnosis not present

## 2021-01-01 DIAGNOSIS — M5386 Other specified dorsopathies, lumbar region: Secondary | ICD-10-CM | POA: Diagnosis not present

## 2021-01-08 DIAGNOSIS — M5386 Other specified dorsopathies, lumbar region: Secondary | ICD-10-CM | POA: Diagnosis not present

## 2021-01-08 DIAGNOSIS — M9902 Segmental and somatic dysfunction of thoracic region: Secondary | ICD-10-CM | POA: Diagnosis not present

## 2021-01-08 DIAGNOSIS — M9904 Segmental and somatic dysfunction of sacral region: Secondary | ICD-10-CM | POA: Diagnosis not present

## 2021-01-08 DIAGNOSIS — M9903 Segmental and somatic dysfunction of lumbar region: Secondary | ICD-10-CM | POA: Diagnosis not present

## 2021-02-11 ENCOUNTER — Other Ambulatory Visit: Payer: Self-pay

## 2021-02-11 ENCOUNTER — Ambulatory Visit (INDEPENDENT_AMBULATORY_CARE_PROVIDER_SITE_OTHER): Payer: BC Managed Care – PPO | Admitting: Orthopaedic Surgery

## 2021-02-11 ENCOUNTER — Ambulatory Visit: Payer: Self-pay

## 2021-02-11 ENCOUNTER — Encounter: Payer: Self-pay | Admitting: Orthopaedic Surgery

## 2021-02-11 DIAGNOSIS — G8929 Other chronic pain: Secondary | ICD-10-CM

## 2021-02-11 DIAGNOSIS — M25511 Pain in right shoulder: Secondary | ICD-10-CM | POA: Diagnosis not present

## 2021-02-11 DIAGNOSIS — M25512 Pain in left shoulder: Secondary | ICD-10-CM | POA: Diagnosis not present

## 2021-02-11 NOTE — Progress Notes (Signed)
Office Visit Note   Patient: Jeffrey Wagner           Date of Birth: 02/16/1956           MRN: [id number] Visit Date: 02/11/2021              Requested by: Martinique, Betty G, MD [address] [place],  [place] [address] PCP: Martinique, Betty G, MD   Assessment & Plan: Visit Diagnoses:  1. Chronic pain of both shoulders     Plan: Impression is chronic bilateral shoulder pain with failure of conservative treatments for the last year.  At this point we will reorder MRIs of the shoulders to rule out structural abnormalities.  Follow-up afterwards.  Follow-Up Instructions: No follow-ups on file.   Orders:  Orders Placed This Encounter  Procedures   XR Shoulder Right   XR Shoulder Left   No orders of the defined types were placed in this encounter.     Procedures: No procedures performed   Clinical Data: No additional findings.   Subjective: Chief Complaint  Patient presents with   Right Shoulder - Follow-up   Left Shoulder - Follow-up    [name] returns today for chronic bilateral shoulder pain worse on the left which has gotten worse since I saw him a year ago.  We ordered an MRI of both shoulders at that time but due to insurance problems he was unable to obtain them.  He states that the pain has gotten worse over the last year despite our conservative management.  He has not had any injuries.  He would like to obtain MRIs at this point.  Interested in cortisone injections.   Review of Systems   Objective: Vital Signs: There were no vitals taken for this visit.  Physical Exam  Ortho Exam  Bilateral shoulder exams show mildly restricted passive range of motion with positive Neer sign.  Negative Hawkins.  Rotator cuff intact to manual muscle testing.  Specialty Comments:  No specialty comments available.  Imaging: XR Shoulder Left  Result Date: 02/11/2021 Stable appearance of large acromial spur  XR Shoulder Right  Result Date: 02/11/2021 Stable  appearance of large acromial spur    PMFS History: Patient Active Problem List   Diagnosis Date Noted   Hx of adenomatous colonic polyps 07/2020   Atherosclerosis of aorta ([place]) 02/13/2020   Right shoulder pain 07/27/2017   Hyperlipidemia 07/26/2017   CNTC DERMATITIS&OTH ECZEMA DUE OTH CHEM PRODUCTS 11/05/2009   ERECTILE DYSFUNCTION, MILD 06/23/2007   TOBACCO ABUSE 02/15/2007   Essential hypertension 12/27/2006   OSTEOARTHRITIS 12/27/2006   LOW BACK PAIN 12/27/2006   Past Medical History:  Diagnosis Date   Arthritis    Hx of adenomatous colonic polyps 07/2020   5 adenomas   Hypertension     Family History  Problem Relation Age of Onset   Colon cancer Neg Hx    Colon polyps Neg Hx    Esophageal cancer Neg Hx    Rectal cancer Neg Hx    Stomach cancer Neg Hx     Past Surgical History:  Procedure Laterality Date   COLONOSCOPY  2008   INGUINAL HERNIA REPAIR     Social History   Occupational History    Employer: [employer]  Tobacco Use   Smoking status: Every Day    Packs/day: 0.50    Years: 35.00    Pack years: 17.50    Types: Cigarettes   Smokeless tobacco: Never   Tobacco comments:  10 cigarettes per day  Substance and Sexual Activity   Alcohol use: Not Currently   Drug use: No   Sexual activity: Yes

## 2021-02-26 ENCOUNTER — Other Ambulatory Visit: Payer: Self-pay | Admitting: Family Medicine

## 2021-02-26 DIAGNOSIS — I10 Essential (primary) hypertension: Secondary | ICD-10-CM

## 2021-03-03 ENCOUNTER — Encounter: Payer: Self-pay | Admitting: *Deleted

## 2021-03-05 ENCOUNTER — Ambulatory Visit (INDEPENDENT_AMBULATORY_CARE_PROVIDER_SITE_OTHER): Payer: BC Managed Care – PPO

## 2021-03-05 ENCOUNTER — Other Ambulatory Visit: Payer: Self-pay

## 2021-03-05 DIAGNOSIS — Z23 Encounter for immunization: Secondary | ICD-10-CM

## 2021-03-10 ENCOUNTER — Telehealth: Payer: Self-pay | Admitting: Orthopaedic Surgery

## 2021-03-24 ENCOUNTER — Other Ambulatory Visit: Payer: BC Managed Care – PPO

## 2021-04-03 NOTE — Progress Notes (Signed)
HPI: Jeffrey Wagner is a 65 y.o.male here today for his routine physical examination and f/u.  Last CPE: 12/11/15 He was last seen on 02/13/20.  Regular exercise 3 or more times per week: He does not have en exercise routine but he active at work and does yard work. He is getting to retired. Following a healthy diet: A lot of cooking at home, fish. Sometimes fried chicken.  Chronic medical problems: chronic back pain, OA,HLD,HTN, and tobacco use.  Immunization History  Administered Date(s) Administered   Influenza Whole 01/13/2011   Influenza, Seasonal, Injecte, Preservative Fre 04/05/2012   Influenza,inj,Quad PF,6+ Mos 04/15/2018, 02/13/2020, 03/05/2021   PFIZER(Purple Top)SARS-COV-2 Vaccination 02/27/2020   Pneumococcal Polysaccharide-23 04/04/2021   Td 06/23/2007   Tdap 04/04/2021   Zoster Recombinat (Shingrix) 02/13/2020, 04/04/2021   Health Maintenance  Topic Date Due   COVID-19 Vaccine (2 - Pfizer series) 04/20/2021 (Originally 03/19/2020)   COLONOSCOPY (Pts 45-61yrs Insurance coverage will need to be confirmed)  07/23/2023   TETANUS/TDAP  04/05/2031   INFLUENZA VACCINE  Completed   Hepatitis C Screening  Completed   HIV Screening  Completed   Zoster Vaccines- Shingrix  Completed   HPV VACCINES  Aged Out   Pneumococcal Vaccine 52-102 Years old  Discontinued   Last prostate ca screening:  Lab Results  Component Value Date   PSA 1.03 10/08/2015  Nocturia x 0-1.  In 12/2015 he was referred for lung cancer screening,declined CT.  -Negative for high alcohol intake. + Smoker.  -Concerns and/or follow up today:  Hyperlipidemia: Currently he is on atorvastatin 20 mg daily. He has not been consistent with following low-fat diet. Aortic atherosclerosis seen on imaging.  Lab Results  Component Value Date   CHOL 238 (H) 02/13/2020   HDL 43 02/13/2020   LDLCALC 158 (H) 02/13/2020   LDLDIRECT 143.3 11/24/2010   TRIG 206 (H) 02/13/2020   CHOLHDL 5.5 (H) 02/13/2020   Hypertension: Currently he is on atenolol-chlorthalidone 50-25 mg daily and amlodipine 5 mg daily. He is not checking BP at home.  Lab Results  Component Value Date   CREATININE 0.68 04/15/2018   BUN 12 04/15/2018   NA 141 04/15/2018   K 3.8 04/15/2018   CL 103 04/15/2018   CO2 29 04/15/2018   Back pain: He is not taking Duloxetine. Requesting refills for muscle relaxant for back spasms, Flexeril, which helped.  Prediabetes: He has not noted polyuria, polydipsia, polyphagia. Lab Results  Component Value Date   HGBA1C 5.8 04/15/2018   ED:He tried Sildenafil 20 mg and did not help. He denies side effects.  Noted hypopigmented confluent rash around his neck.  He is reporting having problem for many years, intermittently, Selsun Blue usually helps.  Review of Systems  Constitutional:  Negative for activity change, appetite change, fatigue and fever.  HENT:  Negative for mouth sores, nosebleeds, sore throat and trouble swallowing.   Eyes:  Negative for redness and visual disturbance.  Respiratory:  Negative for cough, shortness of breath and wheezing.   Cardiovascular:  Negative for chest pain, palpitations and leg swelling.  Gastrointestinal:  Negative for abdominal pain, blood in stool, nausea and vomiting.  Endocrine: Negative for cold intolerance, heat intolerance, polydipsia, polyphagia and polyuria.  Genitourinary:  Negative for decreased urine volume, dysuria, genital sores, hematuria and testicular pain.  Musculoskeletal:  Positive for arthralgias and back pain. Negative for gait problem.  Skin:  Positive for rash.  Allergic/Immunologic: Negative for environmental allergies.  Neurological:  Negative for syncope, weakness and  headaches.  Hematological:  Negative for adenopathy. Does not bruise/bleed easily.  Psychiatric/Behavioral:  Negative for confusion. The patient is not nervous/anxious.    Current Outpatient Medications on File Prior to Visit  Medication Sig  Dispense Refill   amLODipine (NORVASC) 5 MG tablet TAKE 1 TABLET BY MOUTH EVERY DAY 90 tablet 0   aspirin 81 MG tablet Take 81 mg by mouth daily.     atenolol-chlorthalidone (TENORETIC) 50-25 MG tablet TAKE 1 TABLET BY MOUTH EVERY DAY 90 tablet 0   atorvastatin (LIPITOR) 20 MG tablet Take 1 tablet (20 mg total) by mouth daily. 90 tablet 3   fluticasone (FLONASE) 50 MCG/ACT nasal spray Place 2 sprays into both nostrils daily. 16 g 6   No current facility-administered medications on file prior to visit.   Past Medical History:  Diagnosis Date   Arthritis    Hx of adenomatous colonic polyps 07/2020   5 adenomas   Hypertension    Past Surgical History:  Procedure Laterality Date   COLONOSCOPY  2008   INGUINAL HERNIA REPAIR     No Known Allergies  Family History  Problem Relation Age of Onset   Colon cancer Neg Hx    Colon polyps Neg Hx    Esophageal cancer Neg Hx    Rectal cancer Neg Hx    Stomach cancer Neg Hx    Social History   Socioeconomic History   Marital status: Married    Spouse name: Not on file   Number of children: Not on file   Years of education: Not on file   Highest education level: Not on file  Occupational History    Employer: [employer]  Tobacco Use   Smoking status: Every Day    Packs/day: 0.50    Years: 35.00    Pack years: 17.50    Types: Cigarettes   Smokeless tobacco: Never   Tobacco comments:    10 cigarettes per day  Substance and Sexual Activity   Alcohol use: Not Currently   Drug use: No   Sexual activity: Yes  Other Topics Concern   Not on file  Social History Narrative   Not on file   Social Determinants of Health   Financial Resource Strain: Not on file  Food Insecurity: Not on file  Transportation Needs: Not on file  Physical Activity: Not on file  Stress: Not on file  Social Connections: Not on file   Vitals:   04/04/21 0717  BP: 130/80  Pulse: 74  Resp: 16  Temp: 98 F (36.7 C)  SpO2: 99%   Body mass  index is 27.08 kg/m.  Wt Readings from Last 3 Encounters:  04/04/21 183 lb 6 oz (83.2 kg)  07/22/20 191 lb (86.6 kg)  06/24/20 191 lb (86.6 kg)   Physical Exam Vitals and nursing note reviewed.  Constitutional:      General: He is not in acute distress.    Appearance: He is well-developed.  HENT:     Head: Normocephalic and atraumatic.     Right Ear: External ear normal.     Left Ear: External ear normal.     Ears:     Comments: Excess cerumen bilateral, could not see TM's.    Mouth/Throat:     Dentition: Has dentures.  Eyes:     Conjunctiva/sclera: Conjunctivae normal.     Pupils: Pupils are equal, round, and reactive to light.  Neck:     Thyroid: No thyromegaly.     Trachea: No tracheal  deviation.  Cardiovascular:     Rate and Rhythm: Normal rate and regular rhythm.     Pulses:          Dorsalis pedis pulses are 2+ on the right side and 1+ on the left side.     Heart sounds: No murmur heard. Pulmonary:     Effort: Pulmonary effort is normal. No respiratory distress.     Breath sounds: Normal breath sounds.  Abdominal:     Palpations: Abdomen is soft. There is no hepatomegaly or mass.     Tenderness: There is no abdominal tenderness.  Genitourinary:    Comments: Refused,no concerns. Musculoskeletal:        General: No tenderness.     Cervical back: Normal range of motion.     Comments: No major deformities appreciated and no signs of synovitis.  Lymphadenopathy:     Cervical: No cervical adenopathy.     Upper Body:     Right upper body: No supraclavicular adenopathy.     Left upper body: No supraclavicular adenopathy.  Skin:    General: Skin is warm.     Findings: Rash present. No erythema. Rash is macular.       Neurological:     Mental Status: He is alert and oriented to person, place, and time.     Cranial Nerves: No cranial nerve deficit.     Sensory: No sensory deficit.     Coordination: Coordination normal.     Gait: Gait normal.     Deep Tendon  Reflexes:     Reflex Scores:      Bicep reflexes are 2+ on the right side and 2+ on the left side.      Patellar reflexes are 2+ on the right side and 2+ on the left side.  ASSESSMENT AND PLAN:  JeffreyLukis was seen today for annual exam and follow-up.  Diagnoses and all orders for this visit: Orders Placed This Encounter  Procedures   Tdap vaccine greater than or equal to 7yo IM   Varicella-zoster vaccine IM   Pneumococcal polysaccharide vaccine 23-valent greater than or equal to 2yo subcutaneous/IM   Comprehensive metabolic panel   Hemoglobin A1c   Lipid panel   PSA(Must document that pt has been informed of limitations of PSA testing.)   VAS [place] ABI WITH/WO TBI   Lab Results  Component Value Date   CREATININE 0.77 04/04/2021   BUN 10 04/04/2021   NA 137 04/04/2021   K 4.1 04/04/2021   CL 99 04/04/2021   CO2 31 04/04/2021   Lab Results  Component Value Date   PSA 1.06 04/04/2021   Lab Results  Component Value Date   HGBA1C 7.4 (H) 04/04/2021   Lab Results  Component Value Date   CHOL 219 (H) 04/04/2021   HDL 45.50 04/04/2021   LDLCALC 158 (H) 04/04/2021   LDLDIRECT 143.3 11/24/2010   TRIG 76.0 04/04/2021   CHOLHDL 5 04/04/2021   Lab Results  Component Value Date   ALT 14 04/04/2021   AST 12 04/04/2021   ALKPHOS 69 04/04/2021   BILITOT 0.8 04/04/2021   Routine general medical examination at a health care facility We discussed the importance of regular physical activity and healthy diet for prevention of chronic illness and/or complications. Preventive guidelines reviewed. Vaccination updated.  Next CPE in a year.  Decreased pulses in feet Decreased left DP pulse. ABI will be arranged.  Prostate cancer screening -     PSA(Must document that pt has been  informed of limitations of PSA testing.)  Screening for endocrine, metabolic and immunity disorder -     Hemoglobin A1c -     Comprehensive metabolic panel  Chronic left-sided low back pain without  sciatica We discussed some side effects of Flexeril. Medication has helped with chronic back pain, so continue 5-10 mg at bedtime prn. -     cyclobenzaprine (FLEXERIL) 10 MG tablet; Take 0.5-1 tablets (5-10 mg total) by mouth daily as needed for muscle spasms.  Tobacco use disorder We discussed adverse effects of tobacco use and benefits of smoking cessation. He declined lung cancer screening.  Tinea corporis Topic al ketoconazole daily until problem resolved. Selsun blue daily x 10 min on affected skin then rinse.  -     ketoconazole (NIZORAL) 2 % cream; Apply 1 application topically daily.  Need for Tdap vaccination -     Tdap vaccine greater than or equal to 7yo IM  Need for shingles vaccine -     Varicella-zoster vaccine IM  Need for pneumococcal vaccination -     Pneumococcal polysaccharide vaccine 23-valent greater than or equal to 2yo subcutaneous/IM  Hyperlipidemia Continue Atorvastatin 20 mg daily. Low fat diet also recommended. Further recommendations according to FLP result.  Atherosclerosis of aorta ([place]) We discussed Dx and prognosis. Smoking cessation encouraged. He is on Atorvastatin and Aspirin 81 mg daily.  Essential hypertension BP adequately controlled. Continue same dose of Amlodipine,Atenolol,and chlorthalidone. Low salt diet.  Erectile dysfunction We discussed pharmacologic treatment options. Because his health insurance may not cover all her medication, he would like to try again sildenafil but higher doses, 40 to 60 mg daily.  We discussed some side effects.   Return in 6 months (on 10/03/2021) for HTN.  Shantella Blubaugh G. [place], MD  [facility] [place] office.

## 2021-04-04 ENCOUNTER — Encounter: Payer: Self-pay | Admitting: Family Medicine

## 2021-04-04 ENCOUNTER — Ambulatory Visit (INDEPENDENT_AMBULATORY_CARE_PROVIDER_SITE_OTHER): Payer: BC Managed Care – PPO | Admitting: Family Medicine

## 2021-04-04 VITALS — BP 130/80 | HR 74 | Temp 98.0°F | Resp 16 | Ht 69.0 in | Wt 183.4 lb

## 2021-04-04 DIAGNOSIS — Z23 Encounter for immunization: Secondary | ICD-10-CM | POA: Diagnosis not present

## 2021-04-04 DIAGNOSIS — Z13228 Encounter for screening for other metabolic disorders: Secondary | ICD-10-CM | POA: Diagnosis not present

## 2021-04-04 DIAGNOSIS — I10 Essential (primary) hypertension: Secondary | ICD-10-CM | POA: Diagnosis not present

## 2021-04-04 DIAGNOSIS — R0989 Other specified symptoms and signs involving the circulatory and respiratory systems: Secondary | ICD-10-CM

## 2021-04-04 DIAGNOSIS — G8929 Other chronic pain: Secondary | ICD-10-CM

## 2021-04-04 DIAGNOSIS — E785 Hyperlipidemia, unspecified: Secondary | ICD-10-CM | POA: Diagnosis not present

## 2021-04-04 DIAGNOSIS — Z130 Encounter for screening for diseases of the blood and blood-forming organs and certain disorders involving the immune mechanism: Secondary | ICD-10-CM

## 2021-04-04 DIAGNOSIS — Z125 Encounter for screening for malignant neoplasm of prostate: Secondary | ICD-10-CM

## 2021-04-04 DIAGNOSIS — I700 Atherosclerosis of aorta: Secondary | ICD-10-CM

## 2021-04-04 DIAGNOSIS — Z1329 Encounter for screening for other suspected endocrine disorder: Secondary | ICD-10-CM | POA: Diagnosis not present

## 2021-04-04 DIAGNOSIS — N529 Male erectile dysfunction, unspecified: Secondary | ICD-10-CM

## 2021-04-04 DIAGNOSIS — B354 Tinea corporis: Secondary | ICD-10-CM

## 2021-04-04 DIAGNOSIS — E1169 Type 2 diabetes mellitus with other specified complication: Secondary | ICD-10-CM

## 2021-04-04 DIAGNOSIS — F17200 Nicotine dependence, unspecified, uncomplicated: Secondary | ICD-10-CM

## 2021-04-04 DIAGNOSIS — M545 Low back pain, unspecified: Secondary | ICD-10-CM

## 2021-04-04 DIAGNOSIS — Z0000 Encounter for general adult medical examination without abnormal findings: Secondary | ICD-10-CM | POA: Diagnosis not present

## 2021-04-04 LAB — COMPREHENSIVE METABOLIC PANEL
ALT: 14 U/L (ref 0–53)
AST: 12 U/L (ref 0–37)
Albumin: 4.2 g/dL (ref 3.5–5.2)
Alkaline Phosphatase: 69 U/L (ref 39–117)
BUN: 10 mg/dL (ref 6–23)
CO2: 31 mEq/L (ref 19–32)
Calcium: 9.8 mg/dL (ref 8.4–10.5)
Chloride: 99 mEq/L (ref 96–112)
Creatinine, Ser: 0.77 mg/dL (ref 0.40–1.50)
GFR: 95.41 mL/min (ref >60.00)
Glucose, Bld: 170 mg/dL — ABNORMAL HIGH (ref 70–99)
Potassium: 4.1 mEq/L (ref 3.5–5.1)
Sodium: 137 mEq/L (ref 135–145)
Total Bilirubin: 0.8 mg/dL (ref 0.2–1.2)
Total Protein: 7.7 g/dL (ref 6.0–8.3)

## 2021-04-04 LAB — LIPID PANEL
Cholesterol: 219 mg/dL — ABNORMAL HIGH (ref 0–200)
HDL: 45.5 mg/dL (ref >39.00)
LDL Cholesterol: 158 mg/dL — ABNORMAL HIGH (ref 0–99)
NonHDL: 173.23
Total CHOL/HDL Ratio: 5
Triglycerides: 76 mg/dL (ref 0.0–149.0)
VLDL: 15.2 mg/dL (ref 0.0–40.0)

## 2021-04-04 LAB — HEMOGLOBIN A1C: Hgb A1c MFr Bld: 7.4 % — ABNORMAL HIGH (ref 4.6–6.5)

## 2021-04-04 LAB — PSA: PSA: 1.06 ng/mL (ref 0.10–4.00)

## 2021-04-04 MED ORDER — SILDENAFIL CITRATE 20 MG PO TABS: 40.0000 mg | ORAL_TABLET | Freq: Every day | ORAL | 1 refills | Status: DC | PRN

## 2021-04-04 MED ORDER — CYCLOBENZAPRINE HCL 10 MG PO TABS: 5.0000 mg | ORAL_TABLET | Freq: Every day | ORAL | 1 refills | Status: DC | PRN

## 2021-04-04 MED ORDER — KETOCONAZOLE 2 % EX CREA: 1.0000 "application " | TOPICAL_CREAM | Freq: Every day | CUTANEOUS | 1 refills | Status: AC

## 2021-04-04 NOTE — Assessment & Plan Note (Signed)
BP adequately controlled. Continue same dose of Amlodipine,Atenolol,and chlorthalidone. Low salt diet.

## 2021-04-04 NOTE — Assessment & Plan Note (Signed)
We discussed pharmacologic treatment options. Because his health insurance may not cover all her medication, he would like to try again sildenafil but higher doses, 40 to 60 mg daily.  We discussed some side effects.

## 2021-04-04 NOTE — Patient Instructions (Addendum)
A few things to remember from today's visit:  Routine general medical examination at a health care facility  Hyperlipidemia, unspecified hyperlipidemia type - Plan: Lipid panel  Decreased pulses in feet - Plan: VAS [place] ABI WITH/WO TBI  Prostate cancer screening - Plan: PSA(Must document that pt has been informed of limitations of PSA testing.)  Screening for endocrine, metabolic and immunity disorder - Plan: Comprehensive metabolic panel, Hemoglobin A1c  Atherosclerosis of aorta ([facility])  Essential hypertension  Chronic left-sided low back pain without sciatica  Tobacco use disorder  Tinea corporis  If you need refills please call your pharmacy. Do not use My Chart to request refills or for acute issues that need immediate attention.   Please be sure medication list is accurate. If a new problem present, please set up appointment sooner than planned today.  Ketoconazole for skin rash and selsun blue daily for 10 min then rinse. If you change your mind about lung cancer screening let me know.  Health Maintenance, Male Adopting a healthy lifestyle and getting preventive care are important in promoting health and wellness. Ask your health care provider about: The right schedule for you to have regular tests and exams. Things you can do on your own to prevent diseases and keep yourself healthy. What should I know about diet, weight, and exercise? Eat a healthy diet  Eat a diet that includes plenty of vegetables, fruits, low-fat dairy products, and lean protein. Do not eat a lot of foods that are high in solid fats, added sugars, or sodium. Maintain a healthy weight Body mass index (BMI) is a measurement that can be used to identify possible weight problems. It estimates body fat based on height and weight. Your health care provider can help determine your BMI and help you achieve or maintain a healthy weight. Get regular exercise Get regular exercise. This is one of the most  important things you can do for your health. Most adults should: Exercise for at least 150 minutes each week. The exercise should increase your heart rate and make you sweat (moderate-intensity exercise). Do strengthening exercises at least twice a week. This is in addition to the moderate-intensity exercise. Spend less time sitting. Even light physical activity can be beneficial. Watch cholesterol and blood lipids Have your blood tested for lipids and cholesterol at 63 years of age, then have this test every 5 years. You may need to have your cholesterol levels checked more often if: Your lipid or cholesterol levels are high. You are older than 63 years of age. You are at high risk for heart disease. What should I know about cancer screening? Many types of cancers can be detected early and may often be prevented. Depending on your health history and family history, you may need to have cancer screening at various ages. This may include screening for: Colorectal cancer. Prostate cancer. Skin cancer. Lung cancer. What should I know about heart disease, diabetes, and high blood pressure? Blood pressure and heart disease High blood pressure causes heart disease and increases the risk of stroke. This is more likely to develop in people who have high blood pressure readings or are overweight. Talk with your health care provider about your target blood pressure readings. Have your blood pressure checked: Every 3-5 years if you are 11-90 years of age. Every year if you are 63 years old or older. If you are between the ages of 63 and 29 and are a current or former smoker, ask your health care provider if  you should have a one-time screening for abdominal aortic aneurysm (AAA). Diabetes Have regular diabetes screenings. This checks your fasting blood sugar level. Have the screening done: Once every three years after age 64 if you are at a normal weight and have a low risk for diabetes. More often  and at a younger age if you are overweight or have a high risk for diabetes. What should I know about preventing infection? Hepatitis B If you have a higher risk for hepatitis B, you should be screened for this virus. Talk with your health care provider to find out if you are at risk for hepatitis B infection. Hepatitis C Blood testing is recommended for: Everyone born from [birth year] through 1963. Anyone with known risk factors for hepatitis C. Sexually transmitted infections (STIs) You should be screened each year for STIs, including gonorrhea and chlamydia, if: You are sexually active and are younger than 63 years of age. You are older than 63 years of age and your health care provider tells you that you are at risk for this type of infection. Your sexual activity has changed since you were last screened, and you are at increased risk for chlamydia or gonorrhea. Ask your health care provider if you are at risk. Ask your health care provider about whether you are at high risk for HIV. Your health care provider may recommend a prescription medicine to help prevent HIV infection. If you choose to take medicine to prevent HIV, you should first get tested for HIV. You should then be tested every 3 months for as long as you are taking the medicine. Follow these instructions at home: Alcohol use Do not drink alcohol if your health care provider tells you not to drink. If you drink alcohol: Limit how much you have to 0-2 drinks a day. Know how much alcohol is in your drink. In the [place] one drink equals one 12 oz bottle of beer (355 mL), one 5 oz glass of wine (148 mL), or one 1 oz glass of hard liquor (44 mL). Lifestyle Do not use any products that contain nicotine or tobacco. These products include cigarettes, chewing tobacco, and vaping devices, such as e-cigarettes. If you need help quitting, ask your health care provider. Do not use street drugs. Do not share needles. Ask your health care provider  for help if you need support or information about quitting drugs. General instructions Schedule regular health, dental, and eye exams. Stay current with your vaccines. Tell your health care provider if: You often feel depressed. You have ever been abused or do not feel safe at home. Summary Adopting a healthy lifestyle and getting preventive care are important in promoting health and wellness. Follow your health care provider's instructions about healthy diet, exercising, and getting tested or screened for diseases. Follow your health care provider's instructions on monitoring your cholesterol and blood pressure. This information is not intended to replace advice given to you by your health care provider. Make sure you discuss any questions you have with your health care provider. Document Revised: 09/09/2020 Document Reviewed: 09/09/2020 Elsevier Patient Education  [place].

## 2021-04-04 NOTE — Assessment & Plan Note (Signed)
Continue Atorvastatin 20 mg daily. Low fat diet also recommended. Further recommendations according to FLP result. 

## 2021-04-04 NOTE — Assessment & Plan Note (Signed)
We discussed Dx and prognosis. Smoking cessation encouraged. He is on Atorvastatin and Aspirin 81 mg daily.

## 2021-04-05 ENCOUNTER — Encounter: Payer: Self-pay | Admitting: Family Medicine

## 2021-04-05 DIAGNOSIS — E1169 Type 2 diabetes mellitus with other specified complication: Secondary | ICD-10-CM | POA: Insufficient documentation

## 2021-04-07 ENCOUNTER — Other Ambulatory Visit: Payer: Self-pay

## 2021-04-07 MED ORDER — METFORMIN HCL 500 MG PO TABS: 500.0000 mg | ORAL_TABLET | Freq: Two times a day (BID) | ORAL | 4 refills | Status: DC

## 2021-04-18 ENCOUNTER — Other Ambulatory Visit: Payer: Self-pay

## 2021-04-18 ENCOUNTER — Ambulatory Visit (HOSPITAL_COMMUNITY)
Admission: RE | Admit: 2021-04-18 | Discharge: 2021-04-18 | Disposition: A | Discharge status: Home / Self Care | Payer: BC Managed Care – PPO | Source: Ambulatory Visit | Attending: Family Medicine | Admitting: Family Medicine

## 2021-04-18 DIAGNOSIS — R0989 Other specified symptoms and signs involving the circulatory and respiratory systems: Secondary | ICD-10-CM | POA: Diagnosis not present

## 2021-05-29 ENCOUNTER — Ambulatory Visit (INDEPENDENT_AMBULATORY_CARE_PROVIDER_SITE_OTHER): Payer: BC Managed Care – PPO | Admitting: Internal Medicine

## 2021-05-29 VITALS — BP 122/80 | HR 84 | Temp 98.7°F | Ht 69.0 in | Wt 181.9 lb

## 2021-05-29 DIAGNOSIS — R238 Other skin changes: Secondary | ICD-10-CM

## 2021-05-29 NOTE — Progress Notes (Signed)
Established Patient Office Visit     This visit occurred during the SARS-CoV-2 public health emergency.  Safety protocols were in place, including screening questions prior to the visit, additional usage of staff PPE, and extensive cleaning of exam room while observing appropriate contact time as indicated for disinfecting solutions.    CC/Reason for Visit: "bump on skin"  HPI: Jeffrey Wagner is a 66 y.o. male who is coming in today for the above mentioned reasons. Past Medical History is significant for: Newly diagnosed diabetes.  For about the past week he has noticed an area underneath his scrotum with skin breakdown and drainage.  No fevers, no insect bites that he can recall, no injuries.   Past Medical/Surgical History: Past Medical History:  Diagnosis Date   Arthritis    Hx of adenomatous colonic polyps 07/2020   5 adenomas   Hypertension     Past Surgical History:  Procedure Laterality Date   COLONOSCOPY  2008   INGUINAL HERNIA REPAIR      Social History:  reports that he has been smoking cigarettes. He has a 17.50 pack-year smoking history. He has never used smokeless tobacco. He reports that he does not currently use alcohol. He reports that he does not use drugs.  Allergies: No Known Allergies  Family History:  Family History  Problem Relation Age of Onset   Colon cancer Neg Hx    Colon polyps Neg Hx    Esophageal cancer Neg Hx    Rectal cancer Neg Hx    Stomach cancer Neg Hx      Current Outpatient Medications:    amLODipine (NORVASC) 5 MG tablet, TAKE 1 TABLET BY MOUTH EVERY DAY, Disp: 90 tablet, Rfl: 0   aspirin 81 MG tablet, Take 81 mg by mouth daily., Disp: , Rfl:    atenolol-chlorthalidone (TENORETIC) 50-25 MG tablet, TAKE 1 TABLET BY MOUTH EVERY DAY, Disp: 90 tablet, Rfl: 0   atorvastatin (LIPITOR) 20 MG tablet, Take 1 tablet (20 mg total) by mouth daily., Disp: 90 tablet, Rfl: 3   cyclobenzaprine (FLEXERIL) 10 MG tablet, Take 0.5-1 tablets  (5-10 mg total) by mouth daily as needed for muscle spasms., Disp: 30 tablet, Rfl: 1   fluticasone (FLONASE) 50 MCG/ACT nasal spray, Place 2 sprays into both nostrils daily., Disp: 16 g, Rfl: 6   ketoconazole (NIZORAL) 2 % cream, Apply 1 application topically daily., Disp: 45 g, Rfl: 1   metFORMIN (GLUCOPHAGE) 500 MG tablet, Take 1 tablet (500 mg total) by mouth 2 (two) times daily with a meal., Disp: 60 tablet, Rfl: 4   sildenafil (REVATIO) 20 MG tablet, Take 2-3 tablets (40-60 mg total) by mouth daily as needed., Disp: 60 tablet, Rfl: 1  Review of Systems:  Constitutional: Denies fever, chills, diaphoresis, appetite change and fatigue.  HEENT: Denies photophobia, eye pain, redness, hearing loss, ear pain, congestion, sore throat, rhinorrhea, sneezing, mouth sores, trouble swallowing, neck pain, neck stiffness and tinnitus.   Respiratory: Denies SOB, DOE, cough, chest tightness,  and wheezing.   Cardiovascular: Denies chest pain, palpitations and leg swelling.  Gastrointestinal: Denies nausea, vomiting, abdominal pain, diarrhea, constipation, blood in stool and abdominal distention.  Genitourinary: Denies dysuria, urgency, frequency, hematuria, flank pain and difficulty urinating.  Endocrine: Denies: hot or cold intolerance, sweats, changes in hair or nails, polyuria, polydipsia. Musculoskeletal: Denies myalgias, back pain, joint swelling, arthralgias and gait problem.  Skin: Denies pallor, rash. Neurological: Denies dizziness, seizures, syncope, weakness, light-headedness, numbness and headaches.  Hematological: Denies  adenopathy. Easy bruising, personal or family bleeding history  Psychiatric/Behavioral: Denies suicidal ideation, mood changes, confusion, nervousness, sleep disturbance and agitation    Physical Exam: Vitals:   05/29/21 1346  BP: 122/80  Pulse: 84  Temp: 98.7 F (37.1 C)  TempSrc: Oral  SpO2: 99%  Weight: 181 lb 14.4 oz (82.5 kg)  Height: 5\' 9"  (1.753 m)    Body  mass index is 26.86 kg/m.   Constitutional: NAD, calm, comfortable Eyes: PERRL, lids and conjunctivae normal ENMT: Mucous membranes are moist.  Skin: Small area of skin breakdown underneath scrotum without purulent drainage or erythema. Psychiatric: Normal judgment and insight. Alert and oriented x 3. Normal mood.    Impression and Plan:  Skin breakdown -Advised use of barrier cream, see no need for antibiotics at this time.  He knows to reach out to [place] if issues persist.  Time spent: 21 minutes reviewing chart, interviewing and examining patient and formulating plan of care.    Lelon Frohlich, MD [facility]

## 2021-06-02 ENCOUNTER — Encounter: Payer: BC Managed Care – PPO | Attending: Family Medicine | Admitting: Dietician

## 2021-06-02 ENCOUNTER — Other Ambulatory Visit: Payer: Self-pay

## 2021-06-02 ENCOUNTER — Encounter: Payer: Self-pay | Admitting: Dietician

## 2021-06-02 DIAGNOSIS — E1169 Type 2 diabetes mellitus with other specified complication: Secondary | ICD-10-CM | POA: Insufficient documentation

## 2021-06-02 NOTE — Progress Notes (Signed)
Diabetes Self-Management Education  Visit Type: First/Initial  Appt. Start Time: 1610 Appt. End Time: 5027  06/02/2021  [name], identified by name and date of birth, is a 66 y.o. male with a diagnosis of Diabetes: Type 2.   ASSESSMENT  Height 5\' 9"  (1.753 m), weight 185 lb 9.6 oz (84.2 kg). Body mass index is 27.41 kg/m.   Diabetes Self-Management Education - 06/02/21 1640       Visit Information   Visit Type First/Initial      Initial Visit   Diabetes Type Type 2    Are you currently following a meal plan? Yes    Are you taking your medications as prescribed? No    Date Diagnosed December, 2022      Health Coping   How would you rate your overall health? Good      Psychosocial Assessment   Patient Belief/Attitude about Diabetes Afraid   denial as well   Self-management support Doctor's office;Family    Other persons present Patient;Spouse/SO    Patient Concerns Glycemic Control    Special Needs None    Preferred Learning Style No preference indicated    Learning Readiness Ready    How often do you need to have someone help you when you read instructions, pamphlets, or other written materials from your doctor or pharmacy? 2 - Rarely    What is the last grade level you completed in school? 1 yr college      Pre-Education Assessment   Patient understands the diabetes disease and treatment process. Needs Instruction    Patient understands incorporating nutritional management into lifestyle. Needs Instruction    Patient undertands incorporating physical activity into lifestyle. Needs Instruction    Patient understands using medications safely. Needs Instruction    Patient understands monitoring blood glucose, interpreting and using results Needs Instruction    Patient understands prevention, detection, and treatment of acute complications. Needs Instruction    Patient understands prevention, detection, and treatment of chronic complications. Needs Instruction     Patient understands how to develop strategies to address psychosocial issues. Needs Instruction    Patient understands how to develop strategies to promote health/change behavior. Needs Instruction      Complications   Last HgB A1C per patient/outside source 7.4 %   04/04/2021   How often do you check your blood sugar? 0 times/day (not testing)    Postprandial Blood glucose range (mg/dL) 130-179    Have you had a dilated eye exam in the past 12 months? No    Have you had a dental exam in the past 12 months? Yes      Dietary Intake   Breakfast coffee w/sugar and cream    Snack (morning) 12 oz pepsi    Lunch 12 oz Pepsi    Snack (afternoon) 12 oz Pepsi    Dinner 3 slices of meat lovers pizza    Beverage(s) 3 pepsis      Exercise   Exercise Type ADL's;Light (walking / raking leaves)      Patient Education   Previous Diabetes Education No    Disease state  Definition of diabetes, type 1 and 2, and the diagnosis of diabetes;Factors that contribute to the development of diabetes;Explored patient's options for treatment of their diabetes    Nutrition management  Role of diet in the treatment of diabetes and the relationship between the three main macronutrients and blood glucose level;Carbohydrate counting;Reviewed blood glucose goals for pre and post meals and how to evaluate  the patients' food intake on their blood glucose level.;Meal options for control of blood glucose level and chronic complications.    Physical activity and exercise  Role of exercise on diabetes management, blood pressure control and cardiac health.    Medications Reviewed patients medication for diabetes, action, purpose, timing of dose and side effects.    Monitoring Taught/evaluated SMBG meter.;Purpose and frequency of SMBG.;Identified appropriate SMBG and/or A1C goals.    Chronic complications Relationship between chronic complications and blood glucose control;Lipid levels, blood glucose control and heart  disease;Nephropathy, what it is, prevention of, the use of ACE, ARB's and early detection of through urine microalbumia.;Retinopathy and reason for yearly dilated eye exams    Psychosocial adjustment Role of stress on diabetes    Personal strategies to promote health Review risk of smoking and offered smoking cessation      Individualized Goals (developed by patient)   Nutrition Follow meal plan discussed;General guidelines for healthy choices and portions discussed    Physical Activity Exercise 1-2 times per week    Medications Other (comment)   Pt wants to try to lower BG with diet and lifestyle before starting metformin   Monitoring  test my blood glucose as discussed;send in my blood glucose log as discussed    Reducing Risk stop smoking    Health Coping Other (comment)      Post-Education Assessment   Patient understands the diabetes disease and treatment process. Needs Review    Patient understands incorporating nutritional management into lifestyle. Needs Review    Patient undertands incorporating physical activity into lifestyle. Needs Review    Patient understands using medications safely. Needs Review    Patient understands monitoring blood glucose, interpreting and using results Needs Review    Patient understands prevention, detection, and treatment of acute complications. Needs Review    Patient understands prevention, detection, and treatment of chronic complications. Needs Review    Patient understands how to develop strategies to address psychosocial issues. Needs Review    Patient understands how to develop strategies to promote health/change behavior. Needs Review      Outcomes   Expected Outcomes Demonstrated interest in learning. Expect positive outcomes    Future DMSE 2 months    Program Status Not Completed             Individualized Plan for Diabetes Self-Management Training:   Learning Objective:  Patient will have a greater understanding of diabetes  self-management. Patient education plan is to attend individual and/or group sessions per assessed needs and concerns.   Plan:   Patient Instructions  Work towards eating three meals a day, about 5-6 hours apart!  Begin to recognize carbohydrates, proteins, and non-starchy vegetables in your food choices!  Begin to build your meals using the proportions of the Balanced Plate. First, select your carb choice(s) for the meal.Make this 25% of your meal. Next, select your source of protein to pair with your carb choice(s). Make this another 25% of your meal. Finally, complete your meal with a variety of non-starchy vegetables. Make this the remaining 50% of your meal.  Check your blood sugar each morning before eating or drinking (fasting). Look for numbers under 125 mg/dL Check your blood sugar 2 hours after you begin eating a meal. Look for numbers under 180 mg/dL at all times. Your goal A1c is below 7.0%  Consider using Splenda or Stevia to sweeten your coffee instead of regular sugar.  Begin to have Pepsi Zero in place of regular Pepsi. Start  by having half of your Pepsi's be ZERO sugar, and keep working until you can move away from regular soda.       Expected Outcomes:  Demonstrated interest in learning. Expect positive outcomes  Education material provided: ADA - How to Thrive: A Guide for Your Journey with Diabetes and My Plate Blood Sugar Log  If problems or questions, patient to contact team via:  Phone and Email  Future DSME appointment: 2 months

## 2021-06-02 NOTE — Patient Instructions (Addendum)
Work towards eating three meals a day, about 5-6 hours apart!  Begin to recognize carbohydrates, proteins, and non-starchy vegetables in your food choices!  Begin to build your meals using the proportions of the Balanced Plate. First, select your carb choice(s) for the meal.Make this 25% of your meal. Next, select your source of protein to pair with your carb choice(s). Make this another 25% of your meal. Finally, complete your meal with a variety of non-starchy vegetables. Make this the remaining 50% of your meal.  Check your blood sugar each morning before eating or drinking (fasting). Look for numbers under 125 mg/dL Check your blood sugar 2 hours after you begin eating a meal. Look for numbers under 180 mg/dL at all times. Your goal A1c is below 7.0%  Consider using Splenda or Stevia to sweeten your coffee instead of regular sugar.  Begin to have Pepsi Zero in place of regular Pepsi. Start by having half of your Pepsi's be ZERO sugar, and keep working until you can move away from regular soda.

## 2021-07-10 ENCOUNTER — Other Ambulatory Visit: Payer: Self-pay | Admitting: Family Medicine

## 2021-08-04 ENCOUNTER — Encounter: Payer: BC Managed Care – PPO | Attending: Family Medicine | Admitting: Dietician

## 2021-08-04 DIAGNOSIS — E1169 Type 2 diabetes mellitus with other specified complication: Secondary | ICD-10-CM | POA: Insufficient documentation

## 2021-08-13 ENCOUNTER — Other Ambulatory Visit: Payer: Self-pay | Admitting: Family Medicine

## 2021-08-13 DIAGNOSIS — I10 Essential (primary) hypertension: Secondary | ICD-10-CM

## 2021-09-15 NOTE — Telephone Encounter (Signed)
error 

## 2021-09-23 ENCOUNTER — Encounter: Payer: Self-pay | Admitting: Family Medicine

## 2021-09-23 ENCOUNTER — Ambulatory Visit: Payer: 59 | Admitting: Family Medicine

## 2021-09-23 VITALS — BP 124/80 | HR 66 | Temp 98.5°F | Resp 16 | Ht 69.0 in | Wt 181.4 lb

## 2021-09-23 DIAGNOSIS — H6123 Impacted cerumen, bilateral: Secondary | ICD-10-CM

## 2021-09-23 DIAGNOSIS — N529 Male erectile dysfunction, unspecified: Secondary | ICD-10-CM | POA: Diagnosis not present

## 2021-09-23 MED ORDER — DEBROX 6.5 % OT SOLN: 5.0000 [drp] | Freq: Two times a day (BID) | OTIC | 0 refills | Status: AC

## 2021-09-23 MED ORDER — SILDENAFIL CITRATE 20 MG PO TABS: 40.0000 mg | ORAL_TABLET | Freq: Every day | ORAL | 3 refills | Status: AC | PRN

## 2021-09-23 NOTE — Progress Notes (Signed)
ACUTE VISIT Chief Complaint  Patient presents with   ears clogged    Left ear - having trouble hearing.    HPI: [name] is a 66 y.o. male, who is here today complaining of gradual hearing loss, bilateral, L>R, and initially noted 3 months ago. He thinks it is due to cerumen excess. Last week he used OTC product to clean ears, did not help. Left ear tinnitus noted recently. Negative for fever,chills,nasal congestion,sore throat, or ear drainage. No recent URI or travel.  Needs refills on Sildenafil 20 mg, used for ED. No side effects.  Review of Systems  Constitutional:  Negative for activity change and appetite change.  HENT:  Negative for facial swelling and mouth sores.   Respiratory:  Negative for cough, shortness of breath and wheezing.   Gastrointestinal:  Negative for nausea and vomiting.  Neurological:  Negative for dizziness, facial asymmetry, weakness and headaches.  Rest see pertinent positives and negatives per HPI.  Current Outpatient Medications on File Prior to Visit  Medication Sig Dispense Refill   aspirin 81 MG tablet Take 81 mg by mouth daily.     atenolol-chlorthalidone (TENORETIC) 50-25 MG tablet TAKE 1 TABLET BY MOUTH EVERY DAY 90 tablet 0   atorvastatin (LIPITOR) 20 MG tablet Take 1 tablet (20 mg total) by mouth daily. 90 tablet 3   cyclobenzaprine (FLEXERIL) 10 MG tablet Take 0.5-1 tablets (5-10 mg total) by mouth daily as needed for muscle spasms. 30 tablet 1   fluticasone (FLONASE) 50 MCG/ACT nasal spray Place 2 sprays into both nostrils daily. 16 g 6   ketoconazole (NIZORAL) 2 % cream Apply 1 application topically daily. 45 g 1   metFORMIN (GLUCOPHAGE) 500 MG tablet Take 1 tablet (500 mg total) by mouth 2 (two) times daily with a meal. 180 tablet 1   No current facility-administered medications on file prior to visit.   Past Medical History:  Diagnosis Date   Arthritis    Hx of adenomatous colonic polyps 07/2020   5 adenomas    Hypertension    No Known Allergies  Social History   Socioeconomic History   Marital status: Married    Spouse name: Not on file   Number of children: Not on file   Years of education: Not on file   Highest education level: Not on file  Occupational History    Employer: [employer]  Tobacco Use   Smoking status: Every Day    Packs/day: 0.50    Years: 35.00    Pack years: 17.50    Types: Cigarettes   Smokeless tobacco: Never   Tobacco comments:    10 cigarettes per day  Substance and Sexual Activity   Alcohol use: Not Currently   Drug use: No   Sexual activity: Yes  Other Topics Concern   Not on file  Social History Narrative   Not on file   Social Determinants of Health   Financial Resource Strain: Not on file  Food Insecurity: Not on file  Transportation Needs: Not on file  Physical Activity: Not on file  Stress: Not on file  Social Connections: Not on file   Vitals:   09/23/21 1034  BP: 124/80  Pulse: 66  Resp: 16  Temp: 98.5 F (36.9 C)  SpO2: 99%   Body mass index is 26.78 kg/m.  Physical Exam Vitals and nursing note reviewed.  Constitutional:      General: He is not in acute distress.    Appearance: He is well-developed.  HENT:     Head: Normocephalic and atraumatic.     Right Ear: External ear normal.     Left Ear: External ear normal.     Ears:     Comments: Bilateral cerumen impaction.    Nose:     Right Sinus: No maxillary sinus tenderness or frontal sinus tenderness.     Left Sinus: No maxillary sinus tenderness or frontal sinus tenderness.     Mouth/Throat:     Mouth: Mucous membranes are moist.     Dentition: Has dentures.     Pharynx: Oropharynx is clear.  Eyes:     Conjunctiva/sclera: Conjunctivae normal.  Pulmonary:     Effort: Pulmonary effort is normal. No respiratory distress.  Lymphadenopathy:     Head:     Right side of head: No submandibular adenopathy.     Left side of head: No submandibular adenopathy.      Cervical: No cervical adenopathy.  Skin:    General: Skin is warm.     Findings: No erythema or rash.  Neurological:     Mental Status: He is alert and oriented to person, place, and time.  Psychiatric:     Comments: Well groomed, good eye contact.   ASSESSMENT AND PLAN:  [name] was seen today for ears clogged.  Diagnoses and all orders for this visit:  Bilateral hearing loss due to cerumen impaction After oral consent and discussion of risk of procedure, he underwent bilateral ear lavage performed by my assistant.  Tolerated procedure well and reported great improvement of haring loss. Small areas of external ear canal oozing, bilateral. Instructed about warning signs. I did remove cerumen left in right ear with a small curette, some left. Instructed about warning signs. Avoid q tips.  Hearing Screening   '500Hz'$  '1000Hz'$  '2000Hz'$  '4000Hz'$   Right ear Fail Pass Pass Pass  Left ear Pass Pass Pass Pass   -     carbamide peroxide (DEBROX) 6.5 % OTIC solution; Place 5 drops into the left ear 2 (two) times daily.  Erectile dysfunction, unspecified erectile dysfunction type -     sildenafil (REVATIO) 20 MG tablet; Take 2-3 tablets (40-60 mg total) by mouth daily as needed.  Return if symptoms worsen or fail to improve.  Sybol Morre G. [place], MD  [facility]. [place] office.

## 2021-09-23 NOTE — Patient Instructions (Addendum)
A few things to remember from today's visit:  Bilateral hearing loss due to cerumen impaction  Erectile dysfunction, unspecified erectile dysfunction type - Plan: sildenafil (REVATIO) 20 MG tablet  If you need refills please call your pharmacy. Do not use My Chart to request refills or for acute issues that need immediate attention. Still some wax in right ear. Debrox may help. No QTIPs.   Earwax Buildup, Adult The ears produce a substance called earwax that helps keep bacteria out of the ear and protects the skin in the ear canal. Occasionally, earwax can build up in the ear and cause discomfort or hearing loss. What are the causes? This condition is caused by a buildup of earwax. Ear canals are self-cleaning. Ear wax is made in the outer part of the ear canal and generally falls out in small amounts over time. When the self-cleaning mechanism is not working, earwax builds up and can cause decreased hearing and discomfort. Attempting to clean ears with cotton swabs can push the earwax deep into the ear canal and cause decreased hearing and pain. What increases the risk? This condition is more likely to develop in people who: Clean their ears often with cotton swabs. Pick at their ears. Use earplugs or in-ear headphones often, or wear hearing aids. The following factors may also make you more likely to develop this condition: Being male. Being of older age. Naturally producing more earwax. Having narrow ear canals. Having earwax that is overly thick or sticky. Having excess hair in the ear canal. Having eczema. Being dehydrated. What are the signs or symptoms? Symptoms of this condition include: Reduced or muffled hearing. A feeling of fullness in the ear or feeling that the ear is plugged. Fluid coming from the ear. Ear pain or an itchy ear. Ringing in the ear. Coughing. Balance problems. An obvious piece of earwax that can be seen inside the ear canal. How is this  diagnosed? This condition may be diagnosed based on: Your symptoms. Your medical history. An ear exam. During the exam, your health care provider will look into your ear with an instrument called an otoscope. You may have tests, including a hearing test. How is this treated? This condition may be treated by: Using ear drops to soften the earwax. Having the earwax removed by a health care provider. The health care provider may: Flush the ear with water. Use an instrument that has a loop on the end (curette). Use a suction device. Having surgery to remove the wax buildup. This may be done in severe cases. Follow these instructions at home:  Take over-the-counter and prescription medicines only as told by your health care provider. Do not put any objects, including cotton swabs, into your ear. You can clean the opening of your ear canal with a washcloth or facial tissue. Follow instructions from your health care provider about cleaning your ears. Do not overclean your ears. Drink enough fluid to keep your urine pale yellow. This will help to thin the earwax. Keep all follow-up visits as told. If earwax builds up in your ears often or if you use hearing aids, consider seeing your health care provider for routine, preventive ear cleanings. Ask your health care provider how often you should schedule your cleanings. If you have hearing aids, clean them according to instructions from the manufacturer and your health care provider. Contact a health care provider if: You have ear pain. You develop a fever. You have pus or other fluid coming from your ear. You  have hearing loss. You have ringing in your ears that does not go away. You feel like the room is spinning (vertigo). Your symptoms do not improve with treatment. Get help right away if: You have bleeding from the affected ear. You have severe ear pain. Summary Earwax can build up in the ear and cause discomfort or hearing loss. The  most common symptoms of this condition include reduced or muffled hearing, a feeling of fullness in the ear, or feeling that the ear is plugged. This condition may be diagnosed based on your symptoms, your medical history, and an ear exam. This condition may be treated by using ear drops to soften the earwax or by having the earwax removed by a health care provider. Do not put any objects, including cotton swabs, into your ear. You can clean the opening of your ear canal with a washcloth or facial tissue. This information is not intended to replace advice given to you by your health care provider. Make sure you discuss any questions you have with your health care provider. Document Revised: 08/08/2019 Document Reviewed: 08/08/2019 Elsevier Patient Education  [place].   Please be sure medication list is accurate. If a new problem present, please set up appointment sooner than planned today.

## 2021-09-24 ENCOUNTER — Other Ambulatory Visit: Payer: Self-pay | Admitting: Family Medicine

## 2021-09-24 DIAGNOSIS — I10 Essential (primary) hypertension: Secondary | ICD-10-CM

## 2021-10-01 NOTE — Progress Notes (Unsigned)
HPI: Jeffrey Wagner is a 66 y.o. male, who is here today to follow on recent visit.  He was last seen on 09/23/21 for cerumen impaction. Hearing is back to baseline. Denies earache or drainage.  Hyperlipidemia: In 04/2021 it was recommended to increase Atorvastatin to 40 mg, he is still on 20 mg daily. Side effects from medication:None. Aortic atherosclerosis seen on imaging in the past.  Lab Results  Component Value Date   CHOL 219 (H) 04/04/2021   HDL 45.50 04/04/2021   LDLCALC 158 (H) 04/04/2021   LDLDIRECT 143.3 11/24/2010   TRIG 76.0 04/04/2021   CHOLHDL 5 04/04/2021   Hypertension:  Medications: Amlodipine 5 mg daily, atenolol-chlorthalidone 50-25 mg daily. BP readings at home:120's/70-80. Side effects:None. Negative for unusual or severe headache, visual changes, exertional chest pain, dyspnea,  focal weakness, or edema. No claudication like symptoms.  Lab Results  Component Value Date   CREATININE 0.77 04/04/2021   BUN 10 04/04/2021   NA 137 04/04/2021   K 4.1 04/04/2021   CL 99 04/04/2021   CO2 31 04/04/2021   Diabetes Mellitus II: Dx'ed in 04/2021 with a HgA1C 7.4. - Checking BG at home: Not checking. - Medications: He did not start Metformin, thought he can control glucose with dietary changes. - Diet: He stopped sugar intake. - Exercise: He does not have a routine but he is active with house chores. - eye exam: 6 months ago. - foot exam: Never. - Negative for symptoms of hypoglycemia, polyuria, polydipsia, numbness extremities, foot ulcers/trauma  Lab Results  Component Value Date   HGBA1C 7.4 (H) 04/04/2021   Lab Results  Component Value Date   MICROALBUR 0.7 12/11/2015   Review of Systems  Constitutional:  Negative for activity change, appetite change, fatigue and fever.  HENT:  Negative for nosebleeds and sore throat.   Respiratory:  Negative for cough and wheezing.   Gastrointestinal:  Negative for abdominal pain, nausea and vomiting.   Genitourinary:  Negative for decreased urine volume, dysuria and hematuria.  Neurological:  Negative for syncope, facial asymmetry and weakness.  Rest see pertinent positives and negatives per HPI.  Current Outpatient Medications on File Prior to Visit  Medication Sig Dispense Refill   amLODipine (NORVASC) 5 MG tablet TAKE 1 TABLET BY MOUTH EVERY DAY 90 tablet 2   aspirin 81 MG tablet Take 81 mg by mouth daily.     atenolol-chlorthalidone (TENORETIC) 50-25 MG tablet TAKE 1 TABLET BY MOUTH EVERY DAY 90 tablet 0   carbamide peroxide (DEBROX) 6.5 % OTIC solution Place 5 drops into the left ear 2 (two) times daily. 15 mL 0   cyclobenzaprine (FLEXERIL) 10 MG tablet Take 0.5-1 tablets (5-10 mg total) by mouth daily as needed for muscle spasms. 30 tablet 1   fluticasone (FLONASE) 50 MCG/ACT nasal spray Place 2 sprays into both nostrils daily. 16 g 6   ketoconazole (NIZORAL) 2 % cream Apply 1 application topically daily. 45 g 1   sildenafil (REVATIO) 20 MG tablet Take 2-3 tablets (40-60 mg total) by mouth daily as needed. 60 tablet 3   No current facility-administered medications on file prior to visit.   Past Medical History:  Diagnosis Date   Arthritis    Hx of adenomatous colonic polyps 07/2020   5 adenomas   Hypertension    No Known Allergies  Social History   Socioeconomic History   Marital status: Married    Spouse name: Not on file   Number of children: Not on file  Years of education: Not on file   Highest education level: Not on file  Occupational History    Employer: [employer]  Tobacco Use   Smoking status: Every Day    Packs/day: 0.50    Years: 35.00    Pack years: 17.50    Types: Cigarettes   Smokeless tobacco: Never   Tobacco comments:    10 cigarettes per day  Substance and Sexual Activity   Alcohol use: Not Currently   Drug use: No   Sexual activity: Yes  Other Topics Concern   Not on file  Social History Narrative   Not on file   Social  Determinants of Health   Financial Resource Strain: Not on file  Food Insecurity: Not on file  Transportation Needs: Not on file  Physical Activity: Not on file  Stress: Not on file  Social Connections: Not on file   Vitals:   10/03/21 0655  BP: 128/80  Pulse: 81  Resp: 16  Temp: 98.5 F (36.9 C)  SpO2: 98%   Body mass index is 26.6 kg/m.  Physical Exam Nursing note reviewed.  Constitutional:      General: He is not in acute distress.    Appearance: He is well-developed.  HENT:     Head: Normocephalic and atraumatic.     Right Ear: External ear normal.     Left Ear: External ear normal. Tympanic membrane is not erythematous.     Ears:     Comments: Right excess cerumen , could not see TM. Left TM seen partially. Eyes:     Conjunctiva/sclera: Conjunctivae normal.  Cardiovascular:     Rate and Rhythm: Normal rate and regular rhythm.     Pulses:          Dorsalis pedis pulses are 2+ on the right side and 2+ on the left side.     Heart sounds: No murmur heard. Pulmonary:     Effort: Pulmonary effort is normal. No respiratory distress.     Breath sounds: Normal breath sounds.  Abdominal:     Palpations: Abdomen is soft. There is no hepatomegaly or mass.     Tenderness: There is no abdominal tenderness.  Lymphadenopathy:     Cervical: No cervical adenopathy.  Skin:    General: Skin is warm.     Findings: No erythema or rash.  Neurological:     Mental Status: He is alert and oriented to person, place, and time.     Cranial Nerves: No cranial nerve deficit.     Gait: Gait normal.  Psychiatric:     Comments: Well groomed, good eye contact.   ASSESSMENT AND PLAN:  Mr. Zaydenn was seen today for follow-up.  Diagnoses and all orders for this visit:  Orders Placed This Encounter  Procedures   Basic metabolic panel   Lipid panel   Microalbumin / creatinine urine ratio   POC HgB A1c   Lab Results  Component Value Date   MICROALBUR 1.6 10/03/2021   MICROALBUR 0.7  12/11/2015   Lab Results  Component Value Date   HGBA1C 6.8 10/03/2021   Lab Results  Component Value Date   CHOL 212 (H) 10/03/2021   HDL 43.10 10/03/2021   LDLCALC 141 (H) 10/03/2021   LDLDIRECT 143.3 11/24/2010   TRIG 139.0 10/03/2021   CHOLHDL 5 10/03/2021   Lab Results  Component Value Date   CREATININE 0.82 10/03/2021   BUN 14 10/03/2021   NA 139 10/03/2021   K 3.3 (L) 10/03/2021  CL 101 10/03/2021   CO2 30 10/03/2021   Atherosclerosis of aorta (HCC) He is on Atorvastatin 20 mg daily and Aspirin 81 mg daily.  Essential hypertension BP adequately controlled. Continue current management: Amlodipine 5 mg and Atenolol-Chlorthalidone 50-25 mg daily. DASH/low salt diet to continue. Continue monitoring BP at home. Eye exam is current.  Hyperlipidemia He did not increase dose of Atorvastatin , so continue 20 mg daily. FLP check today,ideally LDL should be < 70. Further recommendations according to lipid panel results.  Type 2 diabetes mellitus with other specified complication ([place]) BEE1E is at goal, it went from 7.4 to 6.8. Continue non pharmacologic treatment. Annual eye exam, periodic dental and foot care recommended. F/U in 5-6 months  Return in about 27 weeks (around 04/10/2022) for CPE and f/u.  Ezequiel Macauley G. [place], MD  [facility]. [place] office.

## 2021-10-03 ENCOUNTER — Encounter: Payer: Self-pay | Admitting: Family Medicine

## 2021-10-03 ENCOUNTER — Ambulatory Visit: Payer: 59 | Admitting: Family Medicine

## 2021-10-03 VITALS — BP 128/80 | HR 81 | Temp 98.5°F | Resp 16 | Ht 69.0 in | Wt 180.1 lb

## 2021-10-03 DIAGNOSIS — E876 Hypokalemia: Secondary | ICD-10-CM

## 2021-10-03 DIAGNOSIS — I700 Atherosclerosis of aorta: Secondary | ICD-10-CM

## 2021-10-03 DIAGNOSIS — E785 Hyperlipidemia, unspecified: Secondary | ICD-10-CM

## 2021-10-03 DIAGNOSIS — E1169 Type 2 diabetes mellitus with other specified complication: Secondary | ICD-10-CM

## 2021-10-03 DIAGNOSIS — I10 Essential (primary) hypertension: Secondary | ICD-10-CM | POA: Diagnosis not present

## 2021-10-03 LAB — BASIC METABOLIC PANEL
BUN: 14 mg/dL (ref 6–23)
CO2: 30 mEq/L (ref 19–32)
Calcium: 9.4 mg/dL (ref 8.4–10.5)
Chloride: 101 mEq/L (ref 96–112)
Creatinine, Ser: 0.82 mg/dL (ref 0.40–1.50)
GFR: 93.29 mL/min (ref >60.00)
Glucose, Bld: 133 mg/dL — ABNORMAL HIGH (ref 70–99)
Potassium: 3.3 mEq/L — ABNORMAL LOW (ref 3.5–5.1)
Sodium: 139 mEq/L (ref 135–145)

## 2021-10-03 LAB — MICROALBUMIN / CREATININE URINE RATIO
Creatinine,U: 190.6 mg/dL
Microalb Creat Ratio: 0.8 mg/g (ref 0.0–30.0)
Microalb, Ur: 1.6 mg/dL (ref 0.0–1.9)

## 2021-10-03 LAB — POCT GLYCOSYLATED HEMOGLOBIN (HGB A1C): HbA1c, POC (controlled diabetic range): 6.8 % (ref 0.0–7.0)

## 2021-10-03 LAB — LIPID PANEL
Cholesterol: 212 mg/dL — ABNORMAL HIGH (ref 0–200)
HDL: 43.1 mg/dL (ref >39.00)
LDL Cholesterol: 141 mg/dL — ABNORMAL HIGH (ref 0–99)
NonHDL: 168.49
Total CHOL/HDL Ratio: 5
Triglycerides: 139 mg/dL (ref 0.0–149.0)
VLDL: 27.8 mg/dL (ref 0.0–40.0)

## 2021-10-03 NOTE — Assessment & Plan Note (Signed)
HgA1C is at goal, it went from 7.4 to 6.8. Continue non pharmacologic treatment. Annual eye exam, periodic dental and foot care recommended. F/U in 5-6 months

## 2021-10-03 NOTE — Assessment & Plan Note (Signed)
BP adequately controlled. Continue current management: Amlodipine 5 mg and Atenolol-Chlorthalidone 50-25 mg daily. DASH/low salt diet to continue. Continue monitoring BP at home. Eye exam is current.

## 2021-10-03 NOTE — Assessment & Plan Note (Signed)
He is on Atorvastatin 20 mg daily and Aspirin 81 mg daily.

## 2021-10-03 NOTE — Patient Instructions (Signed)
A few things to remember from today's visit:  Hyperlipidemia, unspecified hyperlipidemia type - Plan: Lipid panel  Essential hypertension - Plan: Basic metabolic panel  Type 2 diabetes mellitus with other specified complication, without long-term current use of insulin ([place]) - Plan: Basic metabolic panel, Microalbumin / creatinine urine ratio  If you need refills please call your pharmacy. Do not use My Chart to request refills or for acute issues that need immediate attention.   Will increase Atorvastatin to 40 mg if cholesterol is not improved. Continue with a healthful diet for diabetes treatment. No changes in your meds today.  Please be sure medication list is accurate. If a new problem present, please set up appointment sooner than planned today.   Diabetes Mellitus and Nutrition, Adult When you have diabetes, or diabetes mellitus, it is very important to have healthy eating habits because your blood sugar (glucose) levels are greatly affected by what you eat and drink. Eating healthy foods in the right amounts, at about the same times every day, can help you: Manage your blood glucose. Lower your risk of heart disease. Improve your blood pressure. Reach or maintain a healthy weight. What can affect my meal plan? Every person with diabetes is different, and each person has different needs for a meal plan. Your health care provider may recommend that you work with a dietitian to make a meal plan that is best for you. Your meal plan may vary depending on factors such as: The calories you need. The medicines you take. Your weight. Your blood glucose, blood pressure, and cholesterol levels. Your activity level. Other health conditions you have, such as heart or kidney disease. How do carbohydrates affect me? Carbohydrates, also called carbs, affect your blood glucose level more than any other type of food. Eating carbs raises the amount of glucose in your blood. It is important  to know how many carbs you can safely have in each meal. This is different for every person. Your dietitian can help you calculate how many carbs you should have at each meal and for each snack. How does alcohol affect me? Alcohol can cause a decrease in blood glucose (hypoglycemia), especially if you use insulin or take certain diabetes medicines by mouth. Hypoglycemia can be a life-threatening condition. Symptoms of hypoglycemia, such as sleepiness, dizziness, and confusion, are similar to symptoms of having too much alcohol. Do not drink alcohol if: Your health care provider tells you not to drink. You are pregnant, may be pregnant, or are planning to become pregnant. If you drink alcohol: Limit how much you have to: 0-1 drink a day for women. 0-2 drinks a day for men. Know how much alcohol is in your drink. In the [place] one drink equals one 12 oz bottle of beer (355 mL), one 5 oz glass of wine (148 mL), or one 1 oz glass of hard liquor (44 mL). Keep yourself hydrated with water, diet soda, or unsweetened iced tea. Keep in mind that regular soda, juice, and other mixers may contain a lot of sugar and must be counted as carbs. What are tips for following this plan?  Reading food labels Start by checking the serving size on the Nutrition Facts label of packaged foods and drinks. The number of calories and the amount of carbs, fats, and other nutrients listed on the label are based on one serving of the item. Many items contain more than one serving per package. Check the total grams (g) of carbs in one serving. Check  the number of grams of saturated fats and trans fats in one serving. Choose foods that have a low amount or none of these fats. Check the number of milligrams (mg) of salt (sodium) in one serving. Most people should limit total sodium intake to less than 2,300 mg per day. Always check the nutrition information of foods labeled as "low-fat" or "nonfat." These foods may be higher in  added sugar or refined carbs and should be avoided. Talk to your dietitian to identify your daily goals for nutrients listed on the label. Shopping Avoid buying canned, pre-made, or processed foods. These foods tend to be high in fat, sodium, and added sugar. Shop around the outside edge of the grocery store. This is where you will most often find fresh fruits and vegetables, bulk grains, fresh meats, and fresh dairy products. Cooking Use low-heat cooking methods, such as baking, instead of high-heat cooking methods, such as deep frying. Cook using healthy oils, such as olive, canola, or sunflower oil. Avoid cooking with butter, cream, or high-fat meats. Meal planning Eat meals and snacks regularly, preferably at the same times every day. Avoid going long periods of time without eating. Eat foods that are high in fiber, such as fresh fruits, vegetables, beans, and whole grains. Eat 4-6 oz (112-168 g) of lean protein each day, such as lean meat, chicken, fish, eggs, or tofu. One ounce (oz) (28 g) of lean protein is equal to: 1 oz (28 g) of meat, chicken, or fish. 1 egg.  cup (62 g) of tofu. Eat some foods each day that contain healthy fats, such as avocado, nuts, seeds, and fish. What foods should I eat? Fruits Berries. Apples. Oranges. Peaches. Apricots. Plums. Grapes. Mangoes. Papayas. Pomegranates. Kiwi. Cherries. Vegetables Leafy greens, including lettuce, spinach, kale, chard, collard greens, mustard greens, and cabbage. Beets. Cauliflower. Broccoli. Carrots. Green beans. Tomatoes. Peppers. Onions. Cucumbers. Brussels sprouts. Grains Whole grains, such as whole-wheat or whole-grain bread, crackers, tortillas, cereal, and pasta. Unsweetened oatmeal. Quinoa. Brown or wild rice. Meats and other proteins Seafood. Poultry without skin. Lean cuts of poultry and beef. Tofu. Nuts. Seeds. Dairy Low-fat or fat-free dairy products such as milk, yogurt, and cheese. The items listed above may not  be a complete list of foods and beverages you can eat and drink. Contact a dietitian for more information. What foods should I avoid? Fruits Fruits canned with syrup. Vegetables Canned vegetables. Frozen vegetables with butter or cream sauce. Grains Refined white flour and flour products such as bread, pasta, snack foods, and cereals. Avoid all processed foods. Meats and other proteins Fatty cuts of meat. Poultry with skin. Breaded or fried meats. Processed meat. Avoid saturated fats. Dairy Full-fat yogurt, cheese, or milk. Beverages Sweetened drinks, such as soda or iced tea. The items listed above may not be a complete list of foods and beverages you should avoid. Contact a dietitian for more information. Questions to ask a health care provider Do I need to meet with a certified diabetes care and education specialist? Do I need to meet with a dietitian? What number can I call if I have questions? When are the best times to check my blood glucose? Where to find more information: American Diabetes Association: diabetes.org Academy of Nutrition and Dietetics: eatright.[employer] of Diabetes and Digestive and Kidney Diseases: [employer] Association of Diabetes Care & Education Specialists: diabeteseducator.org Summary It is important to have healthy eating habits because your blood sugar (glucose) levels are greatly affected by what you eat and  drink. It is important to use alcohol carefully. A healthy meal plan will help you manage your blood glucose and lower your risk of heart disease. Your health care provider may recommend that you work with a dietitian to make a meal plan that is best for you. This information is not intended to replace advice given to you by your health care provider. Make sure you discuss any questions you have with your health care provider. Document Revised: 11/22/2019 Document Reviewed: 11/22/2019 Elsevier Patient Education  [place].

## 2021-10-03 NOTE — Assessment & Plan Note (Signed)
He did not increase dose of Atorvastatin , so continue 20 mg daily. FLP check today,ideally LDL should be < 70. Further recommendations according to lipid panel results.

## 2021-10-04 ENCOUNTER — Encounter: Payer: Self-pay | Admitting: Family Medicine

## 2021-10-04 MED ORDER — ATORVASTATIN CALCIUM 40 MG PO TABS: 40.0000 mg | ORAL_TABLET | Freq: Every day | ORAL | 3 refills | Status: DC

## 2021-10-04 MED ORDER — POTASSIUM CHLORIDE CRYS ER 20 MEQ PO TBCR: 20.0000 meq | EXTENDED_RELEASE_TABLET | Freq: Every day | ORAL | 0 refills | Status: DC

## 2021-11-05 ENCOUNTER — Other Ambulatory Visit: Payer: 59

## 2021-11-10 ENCOUNTER — Other Ambulatory Visit (INDEPENDENT_AMBULATORY_CARE_PROVIDER_SITE_OTHER): Payer: 59

## 2021-11-10 DIAGNOSIS — E876 Hypokalemia: Secondary | ICD-10-CM | POA: Diagnosis not present

## 2021-11-10 LAB — POTASSIUM: Potassium: 3.3 mEq/L — ABNORMAL LOW (ref 3.5–5.1)

## 2021-11-17 ENCOUNTER — Other Ambulatory Visit: Payer: Self-pay

## 2021-11-17 MED ORDER — POTASSIUM CHLORIDE CRYS ER 20 MEQ PO TBCR: 20.0000 meq | EXTENDED_RELEASE_TABLET | Freq: Every day | ORAL | 3 refills | Status: DC

## 2022-03-05 ENCOUNTER — Telehealth: Payer: Self-pay | Admitting: Family Medicine

## 2022-03-05 DIAGNOSIS — I10 Essential (primary) hypertension: Secondary | ICD-10-CM

## 2022-03-05 NOTE — Telephone Encounter (Signed)
Patient stopped by because he needs refill on atenolol-chlorthalidone (TENORETIC) 50-25 MG tablet     Please send to   [place], [place] Phone: [phone]  Fax: [phone]        Please advise

## 2022-03-06 MED ORDER — ATENOLOL-CHLORTHALIDONE 50-25 MG PO TABS: 1.0000 | ORAL_TABLET | Freq: Every day | ORAL | 2 refills | Status: DC

## 2022-03-06 NOTE — Telephone Encounter (Signed)
Rx sent in

## 2023-01-08 ENCOUNTER — Other Ambulatory Visit: Payer: Self-pay | Admitting: Family Medicine

## 2023-01-08 DIAGNOSIS — I10 Essential (primary) hypertension: Secondary | ICD-10-CM

## 2023-01-18 ENCOUNTER — Other Ambulatory Visit: Payer: Self-pay | Admitting: Family Medicine

## 2023-03-03 ENCOUNTER — Other Ambulatory Visit: Payer: Self-pay | Admitting: Family Medicine

## 2023-04-14 ENCOUNTER — Encounter: Payer: Self-pay | Admitting: Family Medicine

## 2023-04-14 ENCOUNTER — Ambulatory Visit: Payer: Self-pay | Admitting: Family Medicine

## 2023-04-14 VITALS — BP 120/70 | HR 95 | Temp 98.3°F | Resp 16 | Ht 69.0 in | Wt 191.0 lb

## 2023-04-14 DIAGNOSIS — I700 Atherosclerosis of aorta: Secondary | ICD-10-CM | POA: Diagnosis not present

## 2023-04-14 DIAGNOSIS — I10 Essential (primary) hypertension: Secondary | ICD-10-CM

## 2023-04-14 DIAGNOSIS — Z136 Encounter for screening for cardiovascular disorders: Secondary | ICD-10-CM

## 2023-04-14 DIAGNOSIS — Z23 Encounter for immunization: Secondary | ICD-10-CM | POA: Diagnosis not present

## 2023-04-14 DIAGNOSIS — E785 Hyperlipidemia, unspecified: Secondary | ICD-10-CM

## 2023-04-14 DIAGNOSIS — E1169 Type 2 diabetes mellitus with other specified complication: Secondary | ICD-10-CM | POA: Diagnosis not present

## 2023-04-14 DIAGNOSIS — G8929 Other chronic pain: Secondary | ICD-10-CM

## 2023-04-14 DIAGNOSIS — Z87891 Personal history of nicotine dependence: Secondary | ICD-10-CM

## 2023-04-14 DIAGNOSIS — E876 Hypokalemia: Secondary | ICD-10-CM

## 2023-04-14 DIAGNOSIS — M545 Low back pain, unspecified: Secondary | ICD-10-CM

## 2023-04-14 LAB — LIPID PANEL
Cholesterol: 259 mg/dL — ABNORMAL HIGH (ref 0–200)
HDL: 51.9 mg/dL (ref >39.00)
LDL Cholesterol: 189 mg/dL — ABNORMAL HIGH (ref 0–99)
NonHDL: 207.5
Total CHOL/HDL Ratio: 5
Triglycerides: 93 mg/dL (ref 0.0–149.0)
VLDL: 18.6 mg/dL (ref 0.0–40.0)

## 2023-04-14 LAB — MICROALBUMIN / CREATININE URINE RATIO
Creatinine,U: 162.2 mg/dL
Microalb Creat Ratio: 1.1 mg/g (ref 0.0–30.0)
Microalb, Ur: 1.8 mg/dL (ref 0.0–1.9)

## 2023-04-14 LAB — COMPREHENSIVE METABOLIC PANEL
ALT: 19 U/L (ref 0–53)
AST: 13 U/L (ref 0–37)
Albumin: 4 g/dL (ref 3.5–5.2)
Alkaline Phosphatase: 77 U/L (ref 39–117)
BUN: 15 mg/dL (ref 6–23)
CO2: 28 meq/L (ref 19–32)
Calcium: 8.8 mg/dL (ref 8.4–10.5)
Chloride: 101 meq/L (ref 96–112)
Creatinine, Ser: 0.77 mg/dL (ref 0.40–1.50)
GFR: 94.06 mL/min (ref >60.00)
Glucose, Bld: 242 mg/dL — ABNORMAL HIGH (ref 70–99)
Potassium: 3.8 meq/L (ref 3.5–5.1)
Sodium: 138 meq/L (ref 135–145)
Total Bilirubin: 1.1 mg/dL (ref 0.2–1.2)
Total Protein: 7.1 g/dL (ref 6.0–8.3)

## 2023-04-14 LAB — POCT GLYCOSYLATED HEMOGLOBIN (HGB A1C): Hemoglobin A1C: 9.4 % — AB (ref 4.0–5.6)

## 2023-04-14 MED ORDER — ATENOLOL-CHLORTHALIDONE 50-25 MG PO TABS: 1.0000 | ORAL_TABLET | Freq: Every day | ORAL | 2 refills | Status: DC

## 2023-04-14 MED ORDER — AMLODIPINE BESYLATE 5 MG PO TABS: 5.0000 mg | ORAL_TABLET | Freq: Every day | ORAL | 3 refills | Status: AC

## 2023-04-14 MED ORDER — ATORVASTATIN CALCIUM 40 MG PO TABS: 40.0000 mg | ORAL_TABLET | Freq: Every day | ORAL | 3 refills | Status: AC

## 2023-04-14 MED ORDER — CYCLOBENZAPRINE HCL 10 MG PO TABS: 5.0000 mg | ORAL_TABLET | Freq: Every day | ORAL | 1 refills | Status: AC | PRN

## 2023-04-14 MED ORDER — POTASSIUM CHLORIDE CRYS ER 20 MEQ PO TBCR: 20.0000 meq | EXTENDED_RELEASE_TABLET | Freq: Every day | ORAL | 1 refills | Status: AC

## 2023-04-14 NOTE — Assessment & Plan Note (Signed)
BP adequately controlled. Continue Amlodipine 5 mg and Atenolol-Chlorthalidone 50-25 mg daily as well as low-salt diet. Continue monitoring BP at home. Eye exam is overdue.

## 2023-04-14 NOTE — Progress Notes (Signed)
HPI: [name] is a 67 y.o. male with a PMHx significant for aortic atherosclerosis, HTN, DM II, OA, HLD, and chronic back pain here today for chronic disease management.  Last seen on 10/03/2021 No new problems since his last visit.  Hypertension:  Currently on amlodipine 5 mg daily and atenolol chlorthalidone 50-25 mg daily.  BP readings at home: He has been checking his BP at home and says his readings are similar to readings today.  Negative for unusual or severe headache, visual changes, exertional chest pain, dyspnea,  focal weakness, or edema.   HypoK+ on KLOR 20 meq daily.  Lab Results  Component Value Date   CREATININE 0.82 10/03/2021   BUN 14 10/03/2021   NA 139 10/03/2021   K 3.3 (L) 11/10/2021   CL 101 10/03/2021   CO2 30 10/03/2021   Hyperlipidemia: He is no longer taking atorvastatin 40 mg. He has been out for about a month.  Aortic atherosclerosis. He is also taking aspirin 81 mg daily.  Lab Results  Component Value Date   CHOL 212 (H) 10/03/2021   HDL 43.10 10/03/2021   LDLCALC 141 (H) 10/03/2021   LDLDIRECT 143.3 11/24/2010   TRIG 139.0 10/03/2021   CHOLHDL 5 10/03/2021   Diabetes Mellitus II: Dx'ed in 04/2021 with a HgA1C 7.4.  - Checking BG at home: He has not been checking his BG at home.  - Not currently on pharmacologic treatment. He has been prescribed metformin in the past but didn't take it. - Diet: He says he has improved his diet and has been eating healthy in general. - Exercise: He says he is unable to exercise because he is too busy caring for his wife who has dementia. He mentions he does a lot of yard work at his own and his children's houses.  - eye exam: He has not seen an eye doctor since his last visit. - foot exam: A year ago..  - Negative for symptoms of hypoglycemia, polyuria, polydipsia, numbness, tingling, or burning extremities, foot ulcers/trauma  Lab Results  Component Value Date   HGBA1C 6.8 10/03/2021   Lab Results   Component Value Date   MICROALBUR 1.6 10/03/2021   Low back pain:  Currently on flexeril 10 mg 0.5-1 tablets prn for back pain. Denies any side effects.  Pain is not radiated, no LE numbness/tingling.  Review of Systems  Constitutional:  Negative for activity change, appetite change, chills and fever.  HENT:  Negative for mouth sores and sore throat.   Respiratory:  Negative for cough and wheezing.   Gastrointestinal:  Negative for abdominal pain, nausea and vomiting.  Endocrine: Negative for cold intolerance and heat intolerance.  Genitourinary:  Negative for decreased urine volume, dysuria and hematuria.  Musculoskeletal:  Positive for back pain. Negative for gait problem.  Skin:  Negative for rash.  Neurological:  Negative for syncope and facial asymmetry.  Psychiatric/Behavioral:  Negative for confusion. The patient is not nervous/anxious.   See other pertinent positives and negatives in HPI.  Current Outpatient Medications on File Prior to Visit  Medication Sig Dispense Refill   aspirin 81 MG tablet Take 81 mg by mouth daily.     carbamide peroxide (DEBROX) 6.5 % OTIC solution Place 5 drops into the left ear 2 (two) times daily. 15 mL 0   fluticasone (FLONASE) 50 MCG/ACT nasal spray Place 2 sprays into both nostrils daily. 16 g 6   ketoconazole (NIZORAL) 2 % cream Apply 1 application topically daily. 45 g  1   potassium chloride SA (KLOR-CON M) 20 MEQ tablet Take 1 tablet (20 mEq total) by mouth daily. Appointment needed for further refills. 30 tablet 0   sildenafil (REVATIO) 20 MG tablet Take 2-3 tablets (40-60 mg total) by mouth daily as needed. 60 tablet 3   No current facility-administered medications on file prior to visit.   Past Medical History:  Diagnosis Date   Arthritis    Hx of adenomatous colonic polyps 07/2020   5 adenomas   Hypertension    No Known Allergies  Social History   Socioeconomic History   Marital status: Married    Spouse name: Not on file    Number of children: Not on file   Years of education: Not on file   Highest education level: Not on file  Occupational History    Employer: [employer]  Tobacco Use   Smoking status: Every Day    Current packs/day: 0.50    Average packs/day: 0.5 packs/day for 35.0 years (17.5 ttl pk-yrs)    Types: Cigarettes   Smokeless tobacco: Never   Tobacco comments:    10 cigarettes per day  Substance and Sexual Activity   Alcohol use: Not Currently   Drug use: No   Sexual activity: Yes  Other Topics Concern   Not on file  Social History Narrative   Not on file   Social Determinants of Health   Financial Resource Strain: Not on file  Food Insecurity: Not on file  Transportation Needs: Not on file  Physical Activity: Not on file  Stress: Not on file  Social Connections: Not on file   Vitals:   04/14/23 0909  BP: 120/70  Pulse: 95  Resp: 16  Temp: 98.3 F (36.8 C)  SpO2: 99%   Body mass index is 28.21 kg/m.  Physical Exam Vitals and nursing note reviewed.  Constitutional:      General: He is not in acute distress.    Appearance: He is well-developed.  HENT:     Head: Normocephalic and atraumatic.     Mouth/Throat:     Mouth: Mucous membranes are moist.     Dentition: Has dentures.     Pharynx: Oropharynx is clear. Uvula midline.  Eyes:     Conjunctiva/sclera: Conjunctivae normal.  Cardiovascular:     Rate and Rhythm: Normal rate and regular rhythm.     Pulses:          Carotid pulses are 2+ on the left side.      Dorsalis pedis pulses are 2+ on the right side and 2+ on the left side.     Heart sounds: No murmur heard. Pulmonary:     Effort: Pulmonary effort is normal. No respiratory distress.     Breath sounds: Normal breath sounds.  Abdominal:     Palpations: Abdomen is soft. There is no hepatomegaly or mass.     Tenderness: There is no abdominal tenderness.  Musculoskeletal:     Lumbar back: No tenderness.     Right lower leg: No edema.     Left lower  leg: No edema.  Lymphadenopathy:     Cervical: No cervical adenopathy.  Skin:    General: Skin is warm.     Findings: No erythema or rash.  Neurological:     Mental Status: He is alert and oriented to person, place, and time.     Cranial Nerves: No cranial nerve deficit.     Gait: Gait normal.  Psychiatric:  Mood and Affect: Mood and affect normal.    ASSESSMENT AND PLAN:  Mr. Kimak was seen today for chronic disease management.  Orders Placed This Encounter  Procedures   Flu Vaccine Trivalent High Dose (Fluad)   Pneumococcal conjugate vaccine 20-valent (Prevnar 20)   Comprehensive metabolic panel   Microalbumin / creatinine urine ratio   Lipid panel   POC HgB A1c   Lab Results  Component Value Date   HGBA1C 9.4 (A) 04/14/2023   Lab Results  Component Value Date   CHOL 259 (H) 04/14/2023   HDL 51.90 04/14/2023   LDLCALC 189 (H) 04/14/2023   LDLDIRECT 143.3 11/24/2010   TRIG 93.0 04/14/2023   CHOLHDL 5 04/14/2023   Lab Results  Component Value Date   MICROALBUR 1.8 04/14/2023   MICROALBUR 1.6 10/03/2021   Lab Results  Component Value Date   ALT 19 04/14/2023   AST 13 04/14/2023   ALKPHOS 77 04/14/2023   BILITOT 1.1 04/14/2023   Lab Results  Component Value Date   NA 138 04/14/2023   CL 101 04/14/2023   K 3.8 04/14/2023   CO2 28 04/14/2023   BUN 15 04/14/2023   CREATININE 0.77 04/14/2023   GFR 94.06 04/14/2023   CALCIUM 8.8 04/14/2023   ALBUMIN 4.0 04/14/2023   GLUCOSE 242 (H) 04/14/2023   Type 2 diabetes mellitus with other specified complication, without long-term current use of insulin (HCC) Assessment & Plan: Problem is not well-controlled, HgA1C today 9.4. We discussed possible complications of elevated glucose. We discussed pharmacologic treatment options, he is not interested in any medication at this time, he will let me know if he decides to do so. Annual eye exam (overdue) and foot care recommended. F/U in 4 months  Orders: -      POCT glycosylated hemoglobin (Hb A1C) -     Comprehensive metabolic panel; Future -     Microalbumin / creatinine urine ratio; Future  Hyperlipidemia, unspecified hyperlipidemia type Assessment & Plan: He has not been on atorvastatin 40 mg for about a month, recommend resuming it. Last LDL 141 in 10/2021. Further recommendation will be given according to lipid panel result.  Orders: -     Comprehensive metabolic panel; Future -     Lipid panel; Future -     Atorvastatin Calcium; Take 1 tablet (40 mg total) by mouth daily.  Dispense: 90 tablet; Refill: 3  Essential hypertension Assessment & Plan: BP adequately controlled. Continue Amlodipine 5 mg and Atenolol-Chlorthalidone 50-25 mg daily as well as low-salt diet. Continue monitoring BP at home. Eye exam is overdue.  Orders: -     Comprehensive metabolic panel; Future -     amLODIPine Besylate; Take 1 tablet (5 mg total) by mouth daily. Due for follow up  Dispense: 90 tablet; Refill: 3 -     Atenolol-Chlorthalidone; Take 1 tablet by mouth daily. Due for follow up  Dispense: 90 tablet; Refill: 2  Atherosclerosis of aorta [facility]) Assessment & Plan: Seen on lumbar x-Bliss in 07/2018. Currently he is on Aspirin 81 mg daily. Recommend resuming atorvastatin 40 mg daily.  Orders: -     Atorvastatin Calcium; Take 1 tablet (40 mg total) by mouth daily.  Dispense: 90 tablet; Refill: 3  Hypokalemia Assessment & Plan: Continue K-Lor 20 mK daily. Further recommendation will be given according to lab results.   Atherosclerosis of aorta [facility]) Assessment & Plan: Seen on lumbar x-Joseluis in 07/2018. Currently he is on Aspirin 81 mg daily. Recommend resuming atorvastatin 40 mg  daily.  Orders: -     Atorvastatin Calcium; Take 1 tablet (40 mg total) by mouth daily.  Dispense: 90 tablet; Refill: 3  Chronic left-sided low back pain without sciatica Assessment & Plan: Stable overall. He would like to continue Flexeril 10 mg 1/2 to 1 tablet daily  as needed, he does not take medication frequently. We discussed side effects.  Orders: -     Cyclobenzaprine HCl; Take 0.5-1 tablets (5-10 mg total) by mouth daily as needed for muscle spasms.  Dispense: 30 tablet; Refill: 1  Need for influenza vaccination -     Flu Vaccine Trivalent High Dose (Fluad)  Need for pneumococcal vaccination -     Pneumococcal conjugate vaccine 20-valent  Screening for abdominal aortic aneurysm -     [place] MEDICARE SCREENING; Future  Personal history of nicotine dependence -     [place] MED[place]ARE SCREENING; Future  I spent a total of 42 minutes in both face to face and non face to face activities for this visit on the date of this encounter. During this time history was obtained and documented, examination was performed, prior labs/imaging reviewed, and assessment/plan discussed.  Return in about 4 months (around 08/13/2023) for chronic problems.  I, Rolla Etienne Wierda, acting as a scribe for Zamantha Strebel [place], MD., have documented all relevant documentation on the behalf of Jannell Franta [place], MD, as directed by  Elizabelle Fite [place], MD while in the presence of Ranjit Ashurst [place], MD.   I, Janeth Terry [place], MD, have reviewed all documentation for this visit. The documentation on 04/14/23 for the exam, diagnosis, procedures, and orders are all accurate and complete.  Arlene Genova G. [place], MD  [facility]. [place] office.

## 2023-04-14 NOTE — Assessment & Plan Note (Signed)
He has not been on atorvastatin 40 mg for about a month, recommend resuming it. Last LDL 141 in 10/2021. Further recommendation will be given according to lipid panel result.

## 2023-04-14 NOTE — Patient Instructions (Addendum)
A few things to remember from today's visit:  Type 2 diabetes mellitus with other specified complication, without long-term current use of insulin (HCC) - Plan: POC HgB A1c, Comprehensive metabolic panel, Microalbumin / creatinine urine ratio  Hyperlipidemia, unspecified hyperlipidemia type - Plan: Comprehensive metabolic panel, Lipid panel, atorvastatin (LIPITOR) 40 MG tablet  Essential hypertension - Plan: Comprehensive metabolic panel, amLODipine (NORVASC) 5 MG tablet, atenolol-chlorthalidone (TENORETIC) 50-25 MG tablet  Atherosclerosis of aorta (HCC)  Hypokalemia  Atherosclerosis of aorta (HCC), Chronic - Plan: atorvastatin (LIPITOR) 40 MG tablet  Chronic left-sided low back pain without sciatica - Plan: cyclobenzaprine (FLEXERIL) 10 MG tablet  Options for blood sugar: Metformin, Jardiance or farxiga, or Ozempic/trulicity.  If you need refills for medications you take chronically, please call your pharmacy. Do not use My Chart to request refills or for acute issues that need immediate attention. If you send a my chart message, it may take a few days to be addressed, specially if I am not in the office.  Please be sure medication list is accurate. If a new problem present, please set up appointment sooner than planned today.

## 2023-04-14 NOTE — Assessment & Plan Note (Signed)
Continue K-Lor 20 mK daily. Further recommendation will be given according to lab results.

## 2023-04-14 NOTE — Assessment & Plan Note (Signed)
Seen on lumbar x-Tylee in 07/2018. Currently he is on Aspirin 81 mg daily. Recommend resuming atorvastatin 40 mg daily.

## 2023-04-14 NOTE — Assessment & Plan Note (Signed)
Problem is not well-controlled, HgA1C today 9.4. We discussed possible complications of elevated glucose. We discussed pharmacologic treatment options, he is not interested in any medication at this time, he will let me know if he decides to do so. Annual eye exam (overdue) and foot care recommended. F/U in 4 months

## 2023-04-14 NOTE — Assessment & Plan Note (Signed)
Stable overall. He would like to continue Flexeril 10 mg 1/2 to 1 tablet daily as needed, he does not take medication frequently. We discussed side effects.

## 2023-04-16 ENCOUNTER — Telehealth (HOSPITAL_BASED_OUTPATIENT_CLINIC_OR_DEPARTMENT_OTHER): Payer: Self-pay | Admitting: Family Medicine

## 2023-04-19 ENCOUNTER — Ambulatory Visit (HOSPITAL_BASED_OUTPATIENT_CLINIC_OR_DEPARTMENT_OTHER)
Admission: RE | Admit: 2023-04-19 | Discharge: 2023-04-19 | Disposition: A | Discharge status: Home / Self Care | Payer: Medicare Other | Source: Ambulatory Visit | Attending: Family Medicine | Admitting: Family Medicine

## 2023-04-19 DIAGNOSIS — Z87891 Personal history of nicotine dependence: Secondary | ICD-10-CM | POA: Insufficient documentation

## 2023-04-19 DIAGNOSIS — Z136 Encounter for screening for cardiovascular disorders: Secondary | ICD-10-CM | POA: Insufficient documentation

## 2023-08-04 ENCOUNTER — Telehealth: Payer: Self-pay | Admitting: Family Medicine

## 2023-08-04 NOTE — Telephone Encounter (Signed)
 Unable to lmom mailbox full pt need welcome to medicare appt with dr [place]

## 2023-08-09 ENCOUNTER — Encounter: Payer: Self-pay | Admitting: Internal Medicine

## 2023-12-22 ENCOUNTER — Telehealth: Payer: Self-pay | Admitting: Family Medicine

## 2023-12-22 DIAGNOSIS — I10 Essential (primary) hypertension: Secondary | ICD-10-CM

## 2023-12-22 MED ORDER — ATENOLOL-CHLORTHALIDONE 50-25 MG PO TABS: 1.0000 | ORAL_TABLET | Freq: Every day | ORAL | 1 refills | Status: AC

## 2023-12-22 NOTE — Telephone Encounter (Signed)
 Patient came by requesting a refill of his atenolol -chlorthalidone  (TENORETIC ) medication. Still using [employer]

## 2023-12-22 NOTE — Telephone Encounter (Signed)
 Rx sent in
